# Patient Record
Sex: Male | Born: 1970 | Race: Black or African American | Hispanic: No | Smoking: Current every day smoker
Health system: Southern US, Community
[De-identification: ages and names within clinical notes are randomized; demographics above are authoritative.]

## PROBLEM LIST (undated history)

## (undated) DIAGNOSIS — J449 Chronic obstructive pulmonary disease, unspecified: Secondary | ICD-10-CM

## (undated) DIAGNOSIS — E119 Type 2 diabetes mellitus without complications: Secondary | ICD-10-CM

## (undated) DIAGNOSIS — I10 Essential (primary) hypertension: Secondary | ICD-10-CM

## (undated) HISTORY — PX: WRIST SURGERY: SHX841

## (undated) HISTORY — PX: TONSILLECTOMY: SUR1361

---

## 2013-01-05 ENCOUNTER — Emergency Department: Payer: Self-pay | Admitting: Emergency Medicine

## 2013-01-05 LAB — CBC
HCT: 46.1 % (ref 40.0–52.0)
HGB: 15.7 g/dL (ref 13.0–18.0)
MCH: 31.1 pg (ref 26.0–34.0)
MCHC: 34 g/dL (ref 32.0–36.0)
MCV: 91 fL (ref 80–100)
Platelet: 207 10*3/uL (ref 150–440)
RBC: 5.05 10*6/uL (ref 4.40–5.90)
RDW: 14.3 % (ref 11.5–14.5)
WBC: 9.8 10*3/uL (ref 3.8–10.6)

## 2013-01-05 LAB — BASIC METABOLIC PANEL
Anion Gap: 5 — ABNORMAL LOW (ref 7–16)
BUN: 12 mg/dL (ref 7–18)
Calcium, Total: 9.1 mg/dL (ref 8.5–10.1)
Chloride: 102 mmol/L (ref 98–107)
Co2: 30 mmol/L (ref 21–32)
Creatinine: 0.97 mg/dL (ref 0.60–1.30)
EGFR (African American): >60
EGFR (Non-African Amer.): >60
Glucose: 98 mg/dL (ref 65–99)
Osmolality: 274 (ref 275–301)
Potassium: 4.2 mmol/L (ref 3.5–5.1)
Sodium: 137 mmol/L (ref 136–145)

## 2013-01-05 LAB — CK TOTAL AND CKMB (NOT AT ARMC)
CK, Total: 654 U/L — ABNORMAL HIGH (ref 35–232)
CK-MB: 6.9 ng/mL — ABNORMAL HIGH (ref 0.5–3.6)

## 2013-01-05 LAB — TROPONIN I: Troponin-I: <0.02 ng/mL

## 2013-01-06 LAB — CK TOTAL AND CKMB (NOT AT ARMC)
CK, Total: 558 U/L — ABNORMAL HIGH (ref 35–232)
CK-MB: 5.5 ng/mL — ABNORMAL HIGH (ref 0.5–3.6)

## 2013-01-06 LAB — TROPONIN I: Troponin-I: <0.02 ng/mL

## 2013-04-12 ENCOUNTER — Observation Stay: Payer: Self-pay | Admitting: Internal Medicine

## 2013-04-12 LAB — BASIC METABOLIC PANEL
Anion Gap: 8 (ref 7–16)
BUN: 13 mg/dL (ref 7–18)
Calcium, Total: 8.4 mg/dL — ABNORMAL LOW (ref 8.5–10.1)
Chloride: 99 mmol/L (ref 98–107)
Co2: 29 mmol/L (ref 21–32)
Creatinine: 0.92 mg/dL (ref 0.60–1.30)
EGFR (African American): >60
EGFR (Non-African Amer.): >60
Glucose: 115 mg/dL — ABNORMAL HIGH (ref 65–99)
Osmolality: 273 (ref 275–301)
Potassium: 3.7 mmol/L (ref 3.5–5.1)
Sodium: 136 mmol/L (ref 136–145)

## 2013-04-12 LAB — TROPONIN I
Troponin-I: <0.02 ng/mL
Troponin-I: <0.02 ng/mL
Troponin-I: <0.02 ng/mL

## 2013-04-12 LAB — CBC
HCT: 44.9 % (ref 40.0–52.0)
HGB: 15.3 g/dL (ref 13.0–18.0)
MCH: 31.1 pg (ref 26.0–34.0)
MCHC: 34.2 g/dL (ref 32.0–36.0)
MCV: 91 fL (ref 80–100)
Platelet: 168 10*3/uL (ref 150–440)
RBC: 4.93 10*6/uL (ref 4.40–5.90)
RDW: 14.2 % (ref 11.5–14.5)
WBC: 5.5 10*3/uL (ref 3.8–10.6)

## 2013-04-12 LAB — PRO B NATRIURETIC PEPTIDE: B-Type Natriuretic Peptide: 20 pg/mL (ref 0–125)

## 2013-04-12 LAB — CK TOTAL AND CKMB (NOT AT ARMC)
CK, Total: 1267 U/L — ABNORMAL HIGH (ref 35–232)
CK, Total: 1188 U/L — ABNORMAL HIGH (ref 35–232)
CK, Total: 1106 U/L — ABNORMAL HIGH (ref 35–232)
CK-MB: 4.8 ng/mL — ABNORMAL HIGH (ref 0.5–3.6)
CK-MB: 4.7 ng/mL — ABNORMAL HIGH (ref 0.5–3.6)
CK-MB: 4.2 ng/mL — ABNORMAL HIGH (ref 0.5–3.6)

## 2013-11-09 ENCOUNTER — Emergency Department: Payer: Self-pay | Admitting: Emergency Medicine

## 2013-11-10 ENCOUNTER — Emergency Department: Payer: Self-pay | Admitting: Emergency Medicine

## 2013-11-10 LAB — COMPREHENSIVE METABOLIC PANEL
Albumin: 3.2 g/dL — ABNORMAL LOW (ref 3.4–5.0)
Alkaline Phosphatase: 77 U/L
Anion Gap: 2 — ABNORMAL LOW (ref 7–16)
BUN: 15 mg/dL (ref 7–18)
Bilirubin,Total: 0.2 mg/dL (ref 0.2–1.0)
Calcium, Total: 8.9 mg/dL (ref 8.5–10.1)
Chloride: 102 mmol/L (ref 98–107)
Co2: 32 mmol/L (ref 21–32)
Creatinine: 1.08 mg/dL (ref 0.60–1.30)
EGFR (African American): >60
EGFR (Non-African Amer.): >60
Glucose: 82 mg/dL (ref 65–99)
Osmolality: 272 (ref 275–301)
Potassium: 4.1 mmol/L (ref 3.5–5.1)
SGOT(AST): 26 U/L (ref 15–37)
SGPT (ALT): 47 U/L (ref 12–78)
Sodium: 136 mmol/L (ref 136–145)
Total Protein: 7.2 g/dL (ref 6.4–8.2)

## 2013-11-10 LAB — CBC
HCT: 49.1 % (ref 40.0–52.0)
HGB: 15.9 g/dL (ref 13.0–18.0)
MCH: 30 pg (ref 26.0–34.0)
MCHC: 32.3 g/dL (ref 32.0–36.0)
MCV: 93 fL (ref 80–100)
Platelet: 229 10*3/uL (ref 150–440)
RBC: 5.28 10*6/uL (ref 4.40–5.90)
RDW: 14.4 % (ref 11.5–14.5)
WBC: 9.7 10*3/uL (ref 3.8–10.6)

## 2013-11-10 LAB — TROPONIN I: Troponin-I: <0.02 ng/mL

## 2013-12-28 ENCOUNTER — Emergency Department: Payer: Self-pay | Admitting: Internal Medicine

## 2014-02-02 ENCOUNTER — Emergency Department: Payer: Self-pay | Admitting: Student

## 2014-02-25 ENCOUNTER — Ambulatory Visit: Payer: Self-pay

## 2014-02-25 ENCOUNTER — Ambulatory Visit: Payer: Self-pay | Admitting: Family Medicine

## 2014-03-07 ENCOUNTER — Emergency Department: Payer: Self-pay | Admitting: Emergency Medicine

## 2014-04-11 ENCOUNTER — Emergency Department: Payer: Self-pay | Admitting: Emergency Medicine

## 2014-05-13 ENCOUNTER — Emergency Department: Payer: Self-pay | Admitting: Emergency Medicine

## 2014-09-09 NOTE — Discharge Summary (Signed)
PATIENT NAME:  Harold Owens, Estle MR#:  [id number] DATE OF BIRTH:  September 15, 1970  DATE OF ADMISSION:  04/12/2013 DATE OF DISCHARGE:  04/12/2013  ADMITTING PHYSICIAN: Dr. Angelica Ranavid Hower.   DISCHARGING PHYSICIAN: Dr. Enid Baasadhika Choua Chalker.   PRIMARY PHYSICIAN: Currently none.   CONSULTATIONS IN THE HOSPITAL: None.   DISCHARGE DIAGNOSES:  1. Atypical chest pain.  2. Acute bronchitis with likely underlying chronic obstructive pulmonary disease exacerbation.  3. Hypertension.  4. Tobacco use disorder.  5. Obesity.   DISCHARGE HOME MEDICATIONS:  1. Prednisone taper.  2. Levaquin 500 mg p.o. daily for 6 days.  3. Albuterol inhaler 2 puffs 4 times a day as needed for wheezing.  4. Lisinopril/HCTZ 10/12.5 mg p.o. daily.   DISCHARGE DIET: Low-sodium diet.   DISCHARGE ACTIVITY: As tolerated.    FOLLOWUP INSTRUCTIONS: PCP followup in 1 to 2 weeks.   LABORATORIES AND IMAGING STUDIES PRIOR TO DISCHARGE: CK elevated at 1106. CK-MB 4.2. Troponin x 3 is negative. Chest x-ray revealing no active disease. Clear lung fields.   WBC 5.5, hemoglobin 15.3, hematocrit 44.9, platelet count 168.   Sodium 136, potassium 3.7, chloride 99, bicarb 29, BUN 13, creatinine 0.92, glucose 115 and calcium of 8.4. D-dimer was normal at 0.41.   BRIEF HOSPITAL COURSE: Harold Owens is an obese, 44 year old, African American male with past medical history significant for smoking and hypertension. Presents to the hospital secondary to cough and chest pain and dyspnea on exertion.  1. COPD exacerbation with likely acute bronchitis. The patient's chest pain was more pleuritic nature from taking deep breaths and also from the cough. He has underlying bronchitis. He was extensively wheezing on the day of exam and was given IV steroids, and his breathing has improved and he is being discharged on p.o. steroid taper and also Levaquin for bronchitis treatment as his cough has become more productive. His chest pain has improved a lot since  admission. He agreed to work on his smoking and try to cut down. He was receiving Symbicort in the hospital which he will take home at the time of discharge and was also discharged on albuterol inhaler for p.r.n. dyspnea.  2. Hypertension: The patient was discharged on his home medication lisinopril/ HCTZ.   His course has been otherwise uneventful in the hospital.   DISCHARGE CONDITION: Stable.   DISCHARGE DISPOSITION: Home.   TIME SPENT ON DISCHARGE: 40 minutes.   Care manager is helping him in trying to get an appointment [facility].   ____________________________ Enid Baasadhika Gregoria Selvy, MD rk:gb D: 04/12/2013 14:56:12 ET T: 04/12/2013 22:41:06 ET JOB#: [id number]  cc: Enid Baasadhika Tawanna Funk, MD, <Dictator> Enid BaasADHIKA Magdiel Bartles MD ELECTRONICALLY SIGNED 04/13/2013 17:51

## 2014-09-09 NOTE — H&P (Signed)
PATIENT NAME:  Harold DiversFLOYD, Harold MR#:  [id number] DATE OF BIRTH:  [birth year]  DATE OF ADMISSION:  04/12/2013  REFERRING PHYSICIAN: Dr. Ladona Ridgelaylor.   PRIMARY CARE PHYSICIAN: None.   CHIEF COMPLAINT: Chest pain, cough.   HISTORY OF PRESENT ILLNESS: This is a 44 year old African American gentleman with past medical history of hypertension, who is presenting with coughing as well as chest pain. He has had a 4 day duration of nonproductive cough with associated shortness of breath; however, denies any nausea, vomiting, fevers or chills. He does, however, mention positive sick contacts, with a girlfriend having similar symptoms. He also has noted a one-day duration of chest pain, which is described as retrosternal, 2 to 3 out of 10 in intensity, aching, radiating to the neck, worse with cough. No relieving factors. During initial work-up, he was found to have lateral EKG changes when compared to an EKG in August. Currently, he is without complaints.  REVIEW OF SYSTEMS:  CONSTITUTIONAL: Denies fevers, fatigue, weakness, pain.  EYES: Denies blurred vision, double vision, eye pain.  EARS, NOSE, THROAT: Denies tinnitus, ear pain, hearing loss.  RESPIRATORY: Positive for cough. Denies wheeze or hemoptysis.  CARDIOVASCULAR: Positive for chest pain. Denies orthopnea, edema, palpitations.  GASTROINTESTINAL: Denies nausea, vomiting, diarrhea, abdominal pain.  GENITOURINARY: Denies dysuria, hematuria.  ENDOCRINE: Denies nocturia or polyuria.  HEMATOLOGIC AND LYMPHATIC: Denies easy bruising or bleeding.  SKIN: Denies rash or lesion.  MUSCULOSKELETAL: Denies pain in neck, back, shoulders, knees, hips, or any arthritic symptoms.  NEUROLOGIC: Denies paralysis, paresthesia.  PSYCHIATRIC: Denies any anxiety or depressive symptoms.  Otherwise, full review of systems performed by me is negative.   PAST MEDICAL HISTORY: Hypertension.   SOCIAL HISTORY: Every day smoker, about 1/2 pack daily. Denies any alcohol or drug  usage.   FAMILY HISTORY: Positive for hypertension.   ALLERGIES: PENICILLIN.   HOME MEDICATIONS: Include hydrochlorothiazide/lisinopril 12.5/10 mg p.o. daily.   PHYSICAL EXAMINATION: VITAL SIGNS: Temperature 98.2, heart rate 93, respirations 20, blood pressure 153/84, saturating 97 on supplemental O2. Weight 138.3 kg, BMI 49.3.  GENERAL: Well-nourished developed, obese, African American gentleman who is currently in no acute distress.  HEAD: Normocephalic, atraumatic.  EYES: Pupils equal, round, reactive to light. Extraocular muscles intact. No scleral icterus.  MOUTH: Moist mucosal membranes. Dentition poor. No abscess noted.  EARS, NOSE, AND THROAT: Throat clear, without exudates. No external lesions. No abscess noted.  NECK: Supple. No thyromegaly. No nodules. No JVD.  PULMONARY: Scattered rhonchi, most prominent in the right upper lobe. No use of accessory muscles. Good respiratory effort.  CHEST: Nontender to palpation.  CARDIOVASCULAR: S1, S2, regular rate and rhythm. No murmurs, rubs, or gallops. No edema. Pedal pulses 2+ bilaterally.  GASTROINTESTINAL: Soft, nontender, nondistended. No masses. No hepatosplenomegaly. Positive bowel sounds, obese.  MUSCULOSKELETAL: No swelling, clubbing or edema. Range of motion full in all extremities.  NEUROLOGIC: Cranial nerves II through XII intact. No gross motor deficits. Sensation intact. Reflexes intact.  SKIN: No ulcerations, lesions, rashes, cyanosis. Skin warm, dry, turgor intact.  PSYCHIATRIC:  Mood and affect within normal limits. The patient is awake, alert, and oriented x3. Insight and judgment are intact.   LABORATORY DATA: Chest x-ray revealing no acute cardiopulmonary process. EKG performed: Normal sinus rhythm, heart rate 77; however, there are lateral T inversions in V4 through V6, with minimal voltage criteria for LVH. The T wave inversions are new when compared to previous EKG in August. Sodium 136, potassium 3.7, chloride 99,  bicarbonate 29, BUN 13, creatinine 0.92, glucose  115, calcium 8.4. Troponin I less than 0.02. WBC 5.5, hemoglobin 15.3, platelets of 168. D-dimer is 0.41.   ASSESSMENT AND PLAN: A 44 year old [place] American gentleman with history of hypertension, percent, cough and chest pain, found to have lateral EKG changes.  1.  Atypical chest pain. Admit to observation telemetry. Trend cardiac enzymes. Check lipid panel. He has been given aspirin thus far. Denies any chest pain. Will give him nitroglycerin if required.  2.  Bronchitis. Provide DuoNeb therapy and flutter, The patient is symptomatically improved. 3.  Hypertension. Continue lisinopril and hydrochlorothiazide.  4.  Deep venous thrombosis prophylaxis with heparin subcutaneous.   CODE STATUS: The patient is full code.  TIME SPENT: 45 minutes.     ____________________________ Cletis Athens. Nailani Full, MD dkh:cg D: 04/12/2013 01:41:36 ET T: 04/12/2013 03:24:34 ET JOB#: [id number]  cc: Cletis Athens. Jeliyah Middlebrooks, MD, <Dictator> Dempsey Knotek Synetta Shadow MD ELECTRONICALLY SIGNED 04/12/2013 3:44

## 2014-09-14 ENCOUNTER — Emergency Department
Admit: 2014-09-14 | Disposition: A | Discharge status: Home / Self Care | Payer: Self-pay | Admitting: Emergency Medicine

## 2014-11-27 ENCOUNTER — Emergency Department
Admission: EM | Admit: 2014-11-27 | Discharge: 2014-11-27 | Disposition: A | Discharge status: Home / Self Care | Payer: 59 | Attending: Student | Admitting: Student

## 2014-11-27 ENCOUNTER — Encounter: Payer: Self-pay | Admitting: Emergency Medicine

## 2014-11-27 DIAGNOSIS — E119 Type 2 diabetes mellitus without complications: Secondary | ICD-10-CM | POA: Diagnosis not present

## 2014-11-27 DIAGNOSIS — I10 Essential (primary) hypertension: Secondary | ICD-10-CM | POA: Diagnosis not present

## 2014-11-27 DIAGNOSIS — H109 Unspecified conjunctivitis: Secondary | ICD-10-CM

## 2014-11-27 DIAGNOSIS — Z79899 Other long term (current) drug therapy: Secondary | ICD-10-CM | POA: Diagnosis not present

## 2014-11-27 DIAGNOSIS — Z720 Tobacco use: Secondary | ICD-10-CM | POA: Diagnosis not present

## 2014-11-27 DIAGNOSIS — Z880 Allergy status to penicillin: Secondary | ICD-10-CM | POA: Insufficient documentation

## 2014-11-27 DIAGNOSIS — H578 Other specified disorders of eye and adnexa: Secondary | ICD-10-CM | POA: Diagnosis present

## 2014-11-27 HISTORY — DX: Chronic obstructive pulmonary disease, unspecified: J44.9

## 2014-11-27 HISTORY — DX: Type 2 diabetes mellitus without complications: E11.9

## 2014-11-27 HISTORY — DX: Essential (primary) hypertension: I10

## 2014-11-27 IMAGING — CR DG KNEE COMPLETE 4+V*R*
1 series · 4 of 4 positions shown · non-contrast
Comparison: None.

CLINICAL DATA: Pain, edema

EXAM:
RIGHT KNEE - COMPLETE 4+ VIEW

[Series 1: ap · 0.17mm/px · 4 of 4 slices shown]
[im 1/4]
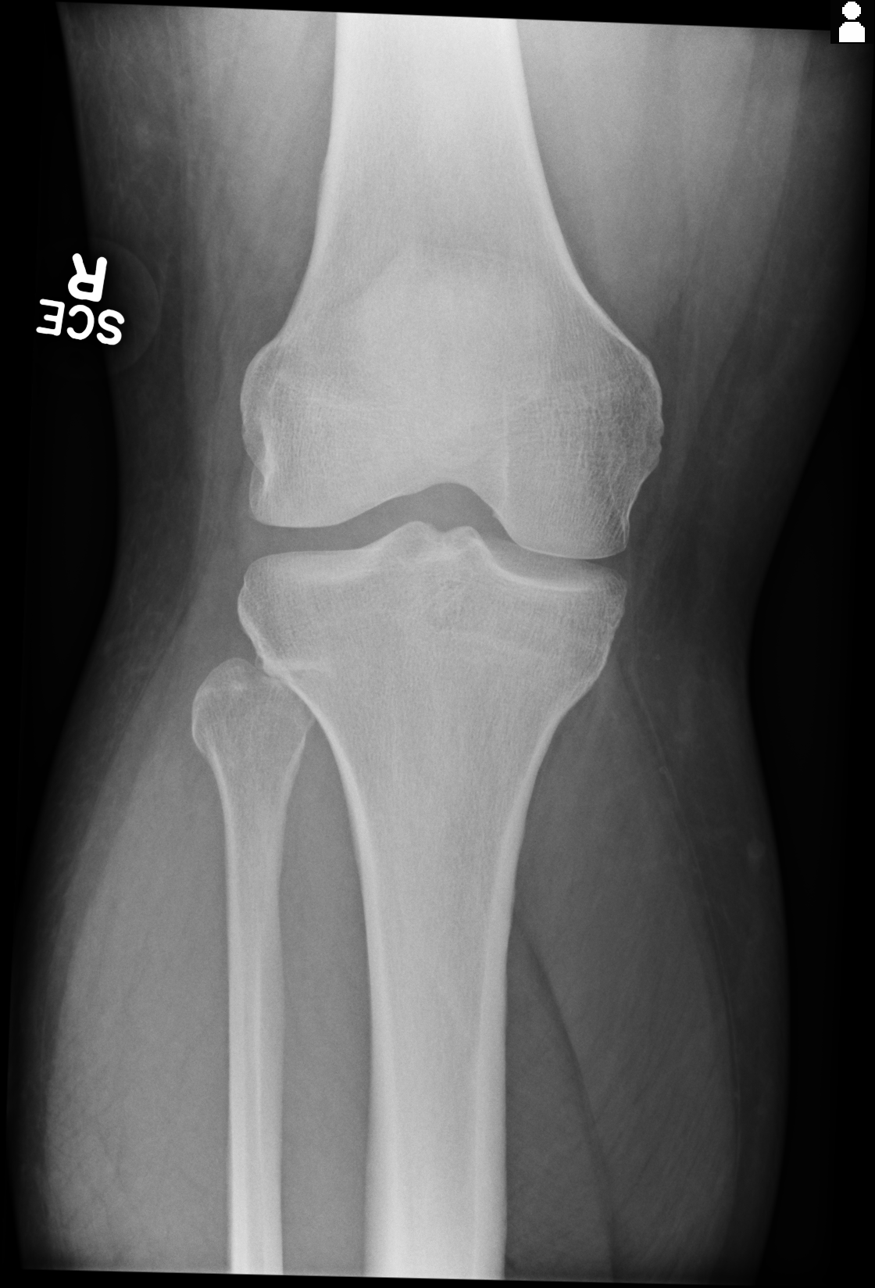
[im 2/4]
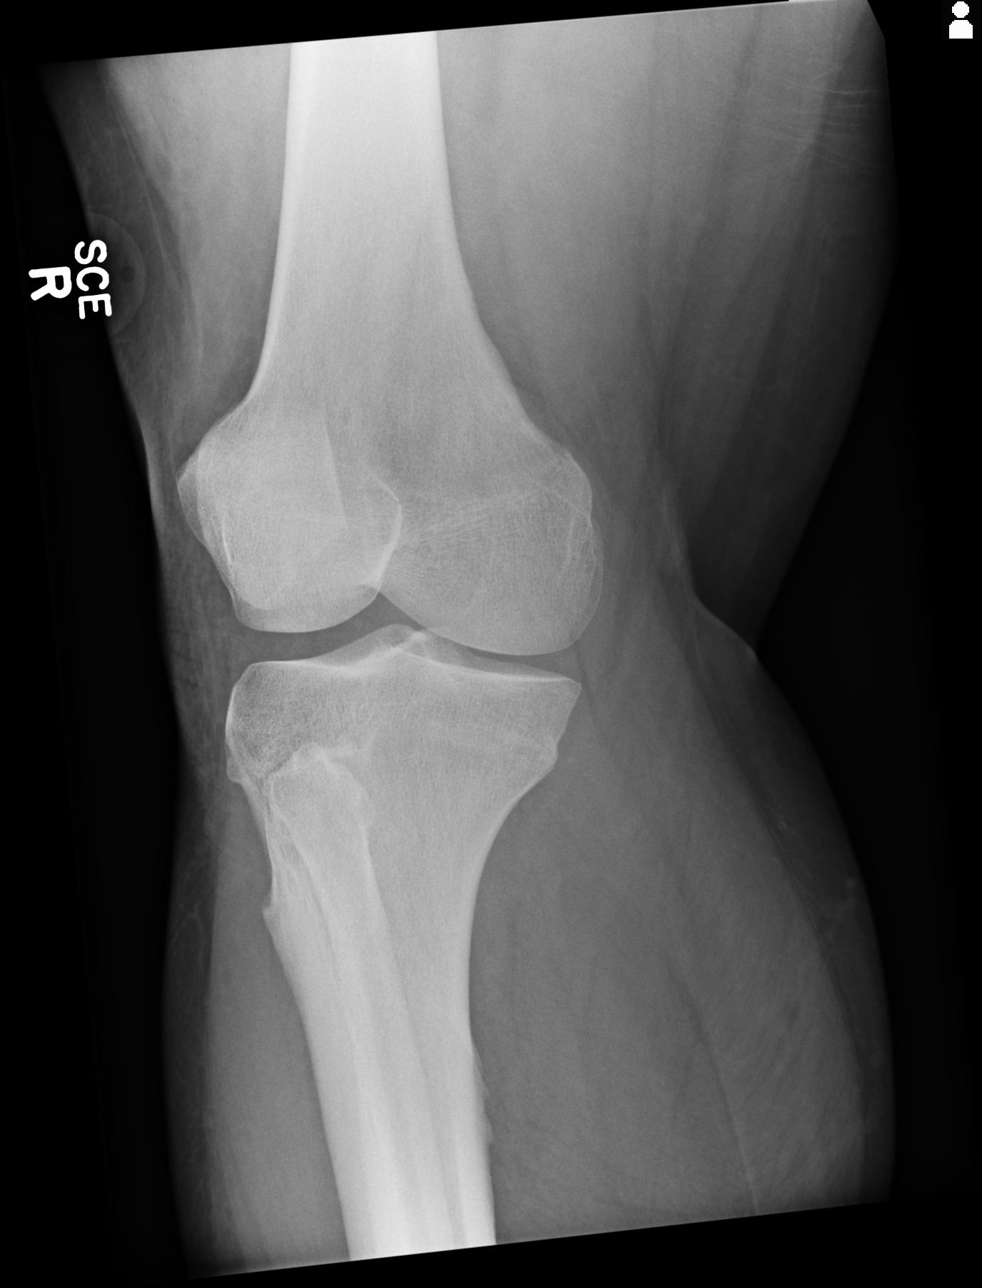
[im 3/4]
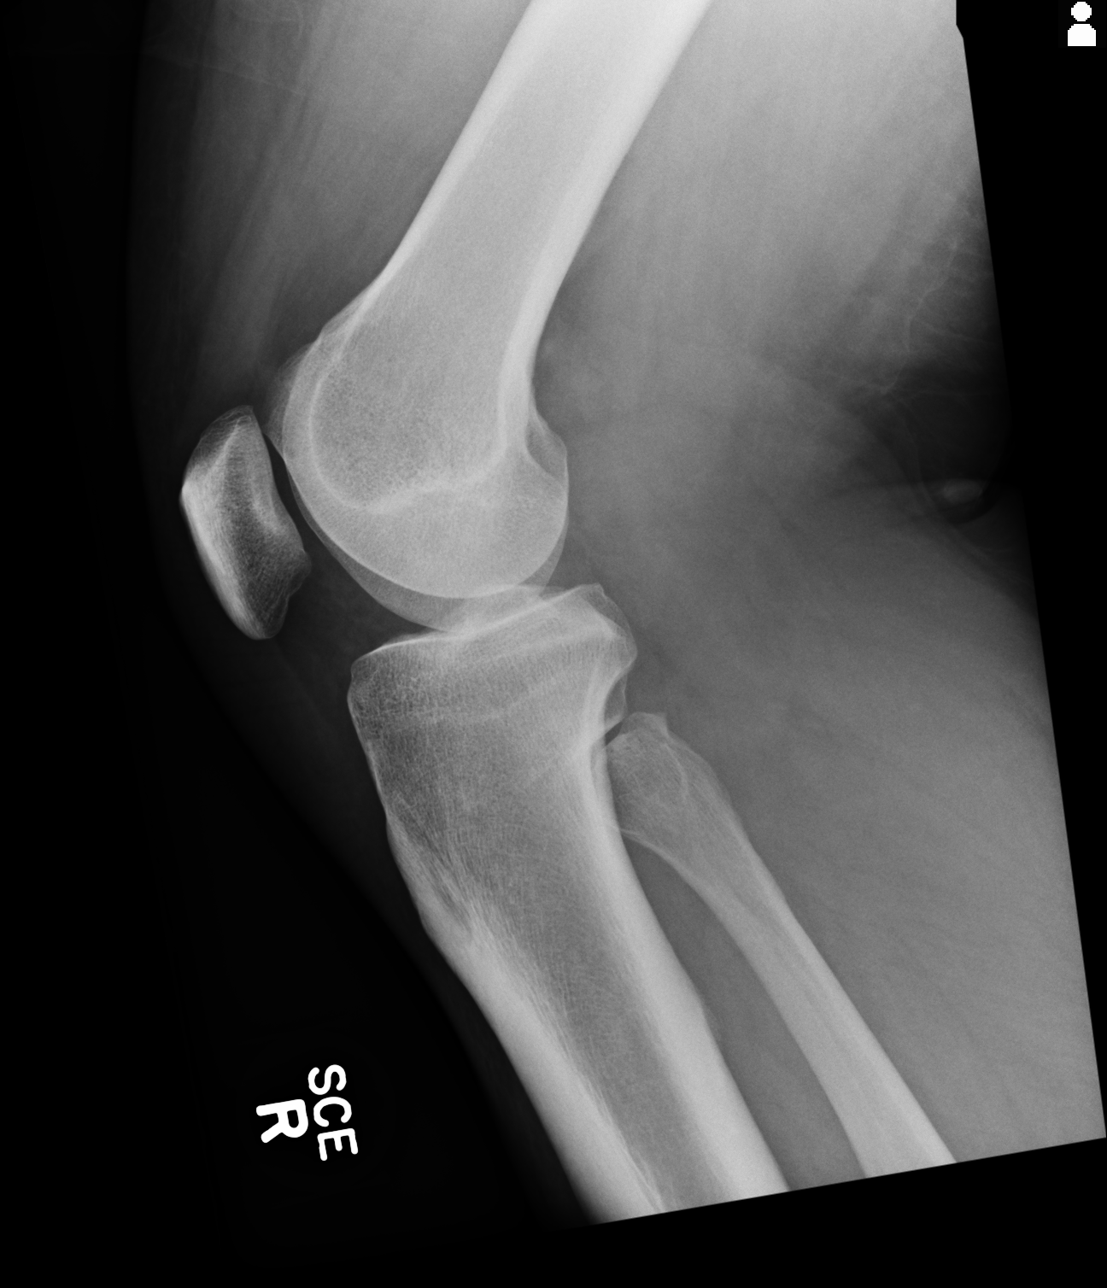
[im 4/4]
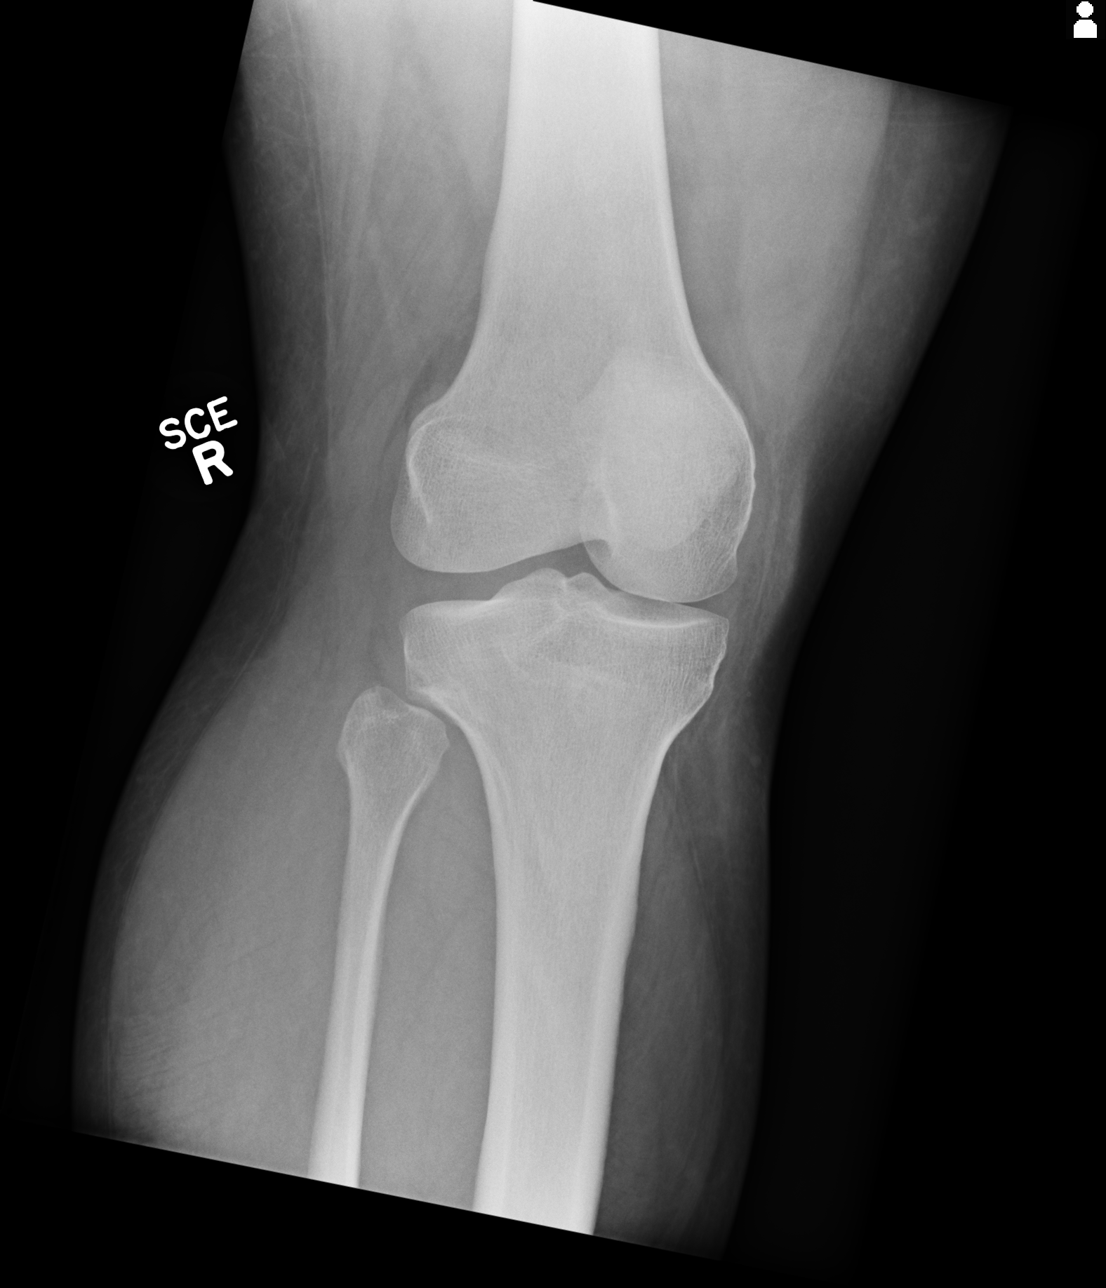

[4 of 4 positions shown; findings below may reference images not displayed]

FINDINGS: Four views of the right knee submitted. Mild narrowing of medial
joint compartment. No acute fracture or subluxation.
IMPRESSION: No acute fracture or subluxation. Mild narrowing of medial joint
compartment.

## 2014-11-27 MED ORDER — ERYTHROMYCIN 5 MG/GM OP OINT
1.0000 "application " | TOPICAL_OINTMENT | Freq: Four times a day (QID) | OPHTHALMIC | Status: DC
  Administered 2014-11-27: 1 via OPHTHALMIC
  Filled 2014-11-27: qty 1

## 2014-11-27 MED ORDER — GENTAMICIN SULFATE 0.3 % OP SOLN: 2.0000 [drp] | OPHTHALMIC | Status: DC

## 2014-11-27 NOTE — Discharge Instructions (Signed)

## 2014-11-27 NOTE — ED Provider Notes (Signed)
[facility] Emergency Department Provider Note ____________________________________________  Time seen: 2238  I have reviewed the triage vital signs and the nursing notes.  HISTORY  Chief Complaint  Burning Eyes  HPI Harold Owens is a [age] y.o. male treatment of pinkeye recurrently in the last year, reports redness and swelling in both eyes. Symptoms were initially in the right eye this morning. He describes increase blurry vision, tearing, and matting to both eyes in the last hour. He denies any fevers, chills, sweats, nausea, headache or vertigo.  Past Medical History  Diagnosis Date  . Hypertension   . Diabetes mellitus without complication     told he was border line diabetic  . COPD (chronic obstructive pulmonary disease)    There are no active problems to display for this patient.  No past surgical history on file.  Current Outpatient Rx  Name  Route  Sig  Dispense  Refill  . hydrochlorothiazide (MICROZIDE) 12.5 MG capsule   Oral   Take 12.5 mg by mouth daily.         Marland Kitchen. gentamicin (GARAMYCIN) 0.3 % ophthalmic solution   Right Eye   Place 2 drops into the right eye every 4 (four) hours.   5 mL   0    Allergies Penicillins  Family History  Problem Relation Age of Onset  . Cancer Mother   . Diabetes Sister   . Hypertension Sister   . Hypertension Brother     Social History History  Substance Use Topics  . Smoking status: Current Every Day Smoker -- 1.00 packs/day  . Smokeless tobacco: Not on file  . Alcohol Use: No   Review of Systems  Constitutional: Negative for fever. Eyes: Positive for visual changes as above ENT: Negative for sore throat. Cardiovascular: Negative for chest pain. Respiratory: Negative for shortness of breath. Gastrointestinal: Negative for abdominal pain, vomiting and diarrhea. Genitourinary: Negative for dysuria. Musculoskeletal: Negative for back pain. Skin: Negative for rash. Neurological:  Negative for headaches, focal weakness or numbness. ____________________________________________  PHYSICAL EXAM:  VITAL SIGNS: ED Triage Vitals  Enc Vitals Group     BP 11/27/14 2138 147/86 mmHg     Pulse Rate 11/27/14 2138 94     Resp --      Temp 11/27/14 2138 98.3 F (36.8 C)     Temp Source 11/27/14 2138 Oral     SpO2 11/27/14 2138 96 %     Weight 11/27/14 2138 300 lb (136.079 kg)     Height 11/27/14 2138 5\' 6"  (1.676 m)     Head Cir --      Peak Flow --      Pain Score 11/27/14 2156 3     Pain Loc --      Pain Edu? --      Excl. in GC? --    Constitutional: Alert and oriented. Well appearing and in no distress. Eyes: Conjunctivae are injected bilaterally. PERRL. Normal extraocular movements. Mild purulent matting is noted in the lashes.  ENT   Head: Normocephalic and atraumatic.   Nose: No congestion/rhinnorhea.   Mouth/Throat: Mucous membranes are moist.   Neck: Supple. No thyromegaly. Hematological/Lymphatic/Immunilogical: No cervical or preauricular lymphadenopathy. Cardiovascular: Normal rate, regular rhythm.  Respiratory: Normal respiratory effort. Musculoskeletal: Nontender with normal range of motion in all extremities.  Neurologic:  Normal gait without ataxia. Normal speech and language. No gross focal neurologic deficits are appreciated. Skin:  Skin is warm, dry and intact. No rash noted. Psychiatric: Mood and  affect are normal. Patient exhibits appropriate insight and judgment. ____________________________________________  PROCEDURES  Erythromycin ophthalmic ointment instilled in both eyes ____________________________________________  INITIAL IMPRESSION / ASSESSMENT AND PLAN / ED COURSE  Acute conjunctivitis. Treatment with Garamycin x 5 days. Follow-up optometrist as needed.  ____________________________________________  FINAL CLINICAL IMPRESSION(S) / ED DIAGNOSES  Final diagnoses:  Conjunctivitis of both eyes     Lissa Hoard, PA-C 11/27/14 2300  Gayla Doss, MD 11/28/14 [phone]

## 2014-11-27 NOTE — ED Notes (Signed)
AAOx3.  Skin warm and dry.  NAD.  D/C home 

## 2014-11-27 NOTE — ED Notes (Signed)
Pt. Has redness and swelling in both eyes.  Pt. States symptoms first started rt. Eye this morining.  Pt. States hx of pink eye.  Pt. States having pink eye three times last year.  Pt. States both eyes feel blurry in last hour.

## 2014-11-28 IMAGING — CR DG CHEST 2V
1 series · 2 of 2 positions shown · non-contrast
Comparison: 04/12/2013

CLINICAL DATA: Chest pain

EXAM:
CHEST  2 VIEW

[Series 1: w chest pa · 0.14mm/px · 2 of 2 slices shown]
[im 1/2]
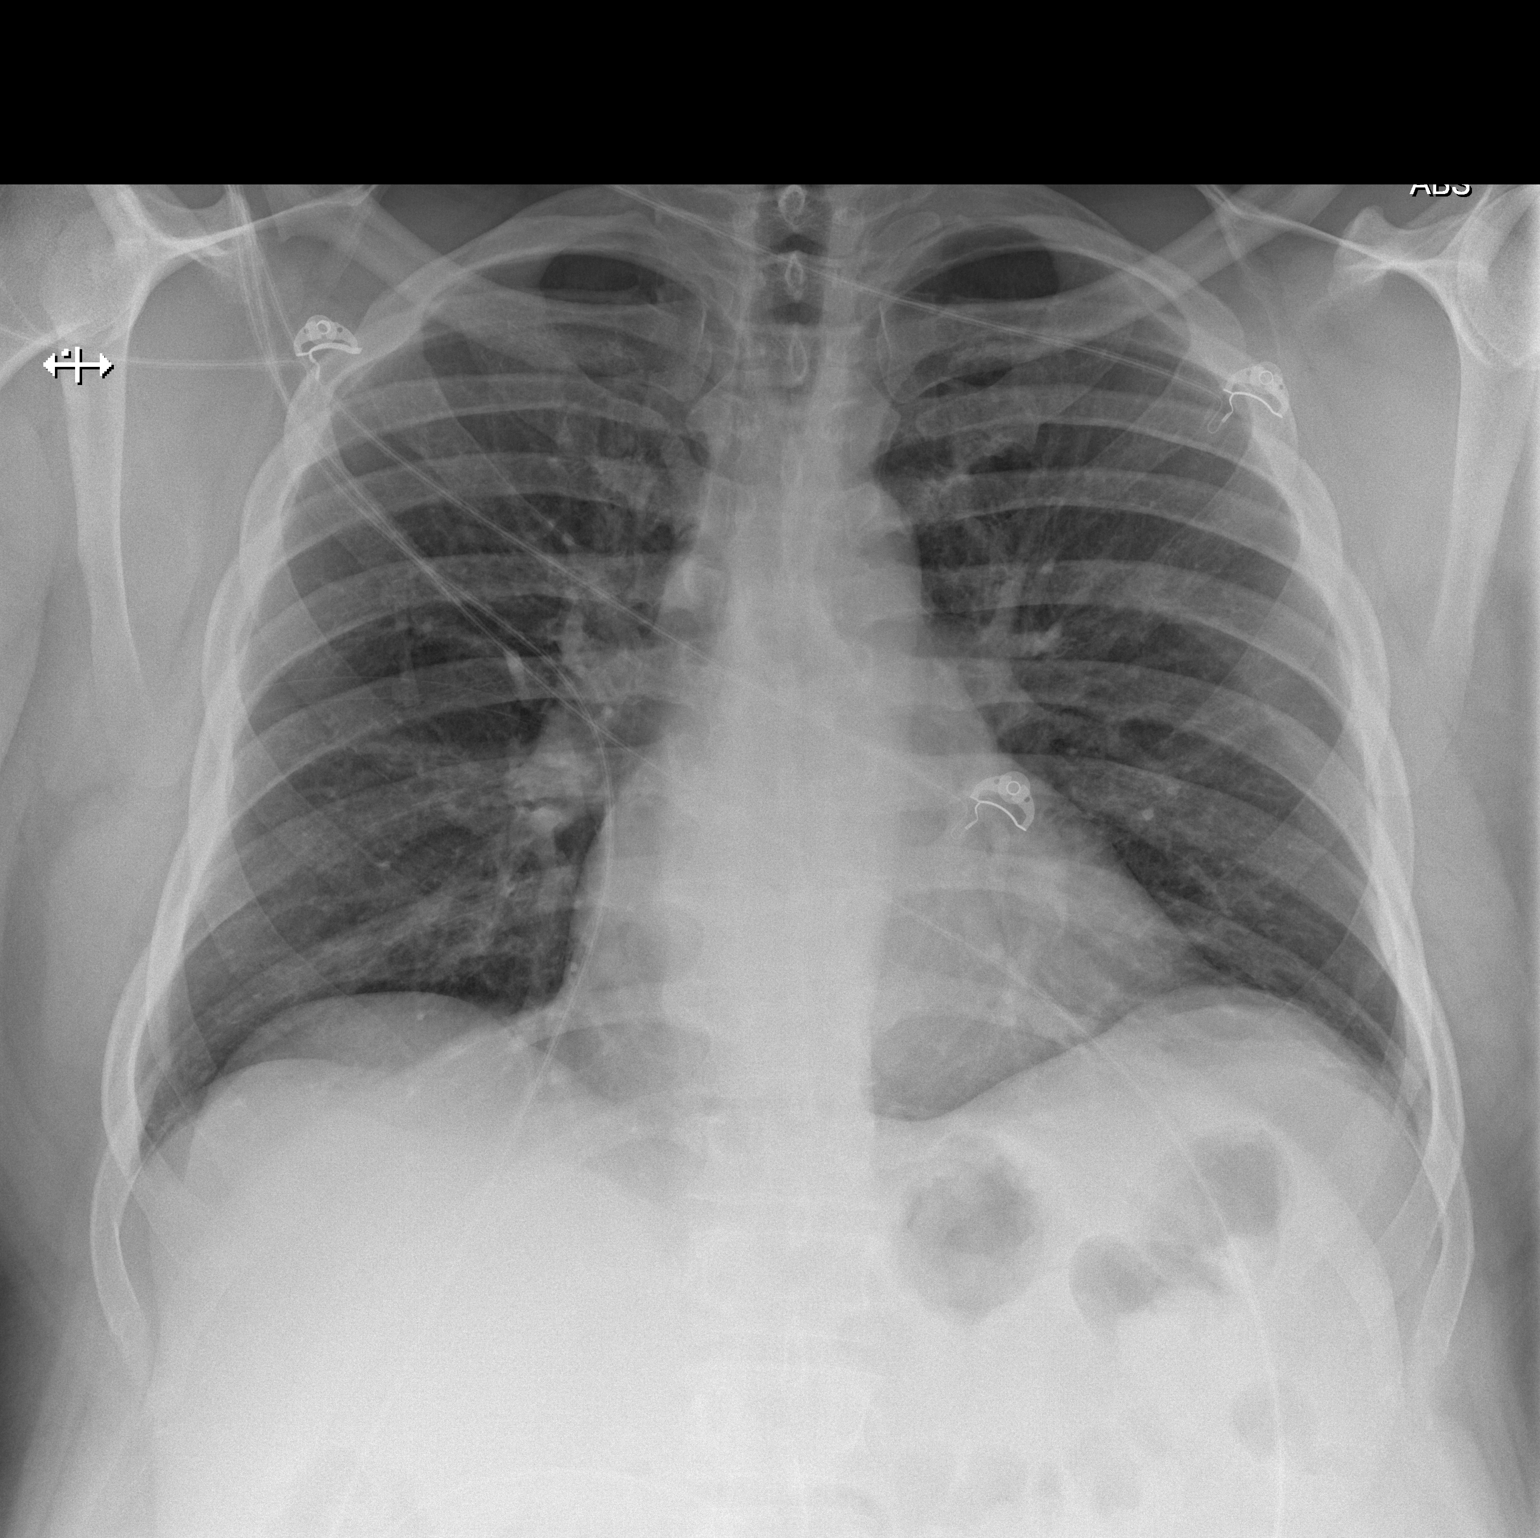
[im 2/2]
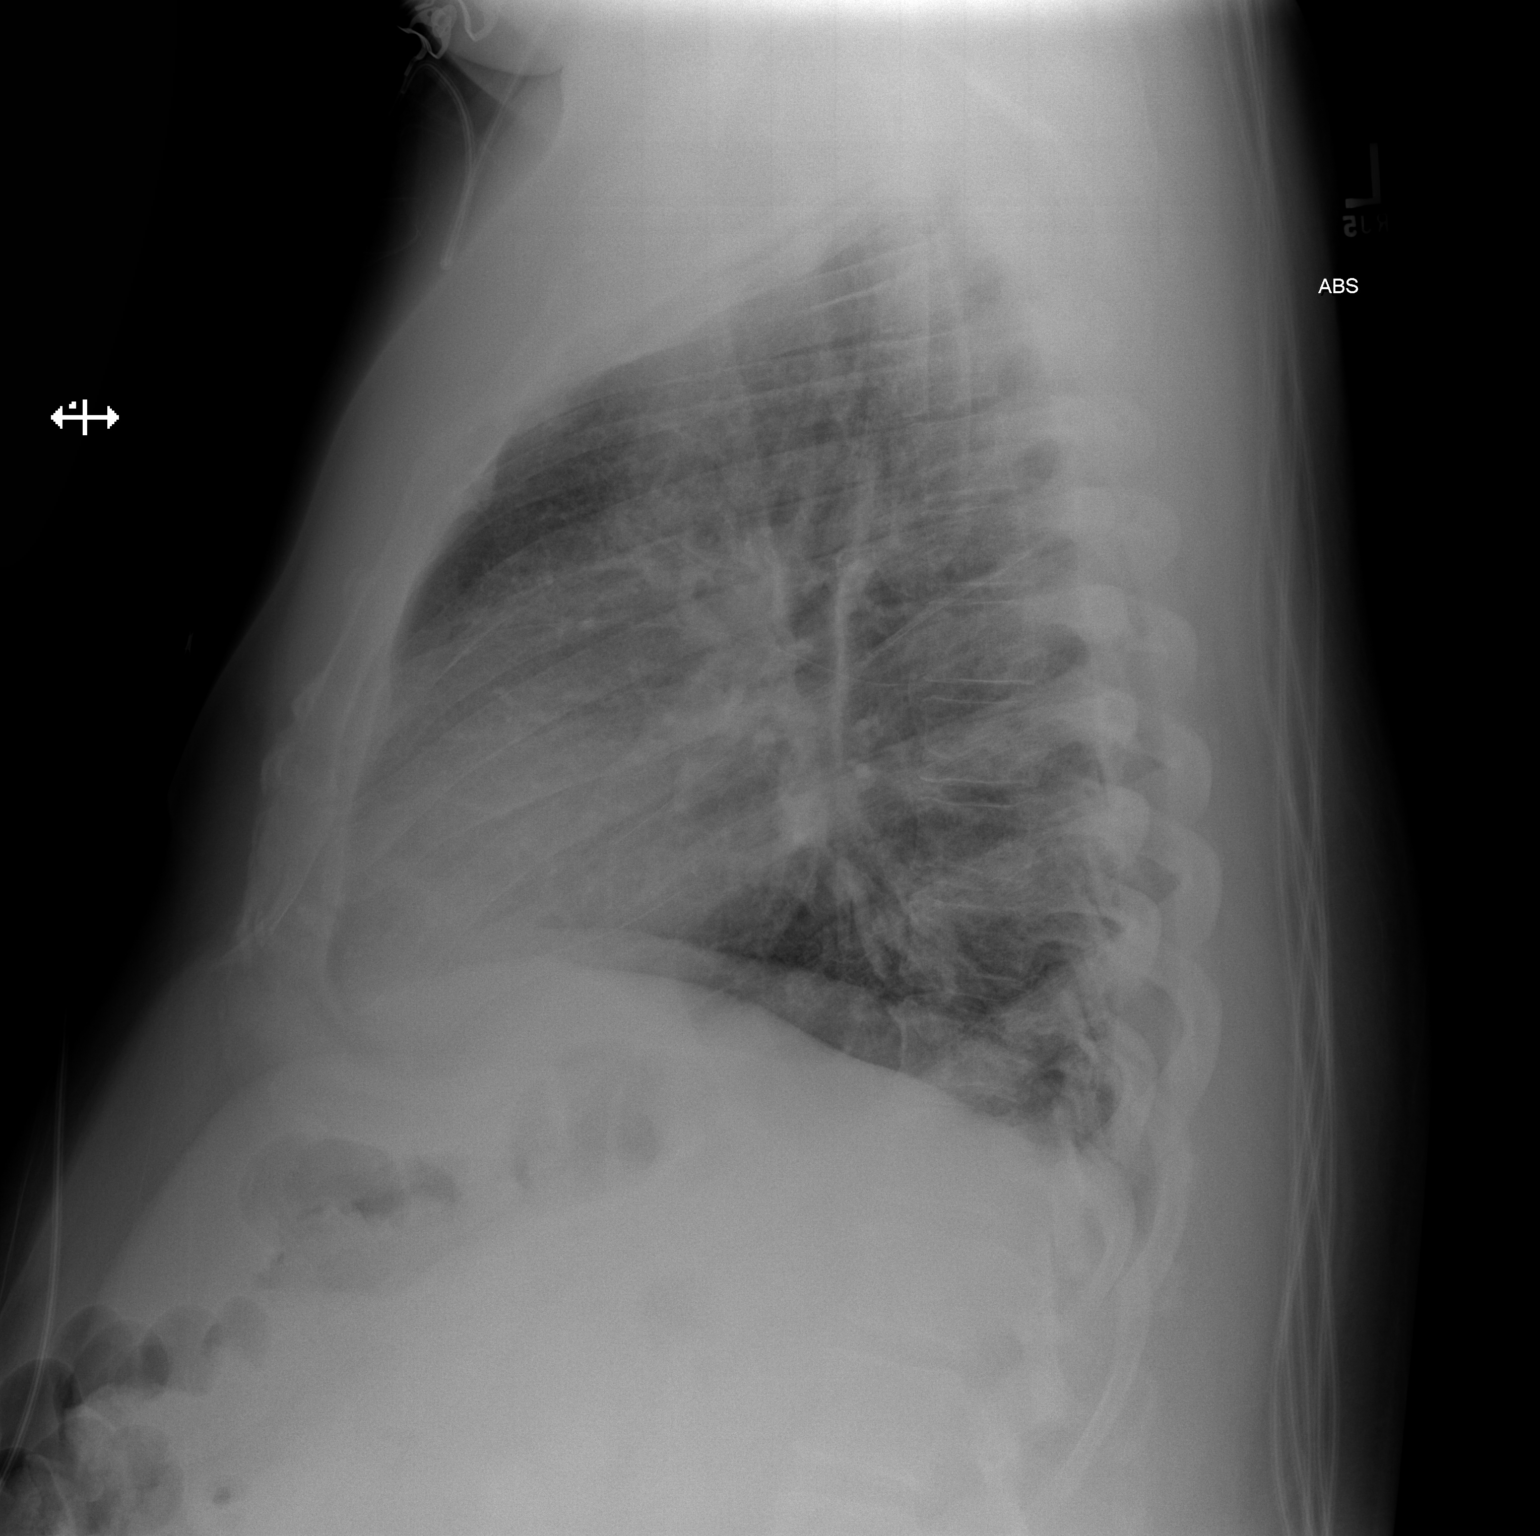

[2 of 2 positions shown; findings below may reference images not displayed]

FINDINGS: The heart size and mediastinal contours are within normal limits.
Both lungs are clear. The visualized skeletal structures are
unremarkable.
IMPRESSION: No active cardiopulmonary disease.

## 2014-12-15 ENCOUNTER — Emergency Department
Admission: EM | Admit: 2014-12-15 | Discharge: 2014-12-15 | Disposition: A | Discharge status: Home / Self Care | Payer: 59 | Attending: Emergency Medicine | Admitting: Emergency Medicine

## 2014-12-15 DIAGNOSIS — H6001 Abscess of right external ear: Secondary | ICD-10-CM | POA: Diagnosis not present

## 2014-12-15 DIAGNOSIS — Z720 Tobacco use: Secondary | ICD-10-CM | POA: Insufficient documentation

## 2014-12-15 DIAGNOSIS — I10 Essential (primary) hypertension: Secondary | ICD-10-CM | POA: Diagnosis not present

## 2014-12-15 DIAGNOSIS — L989 Disorder of the skin and subcutaneous tissue, unspecified: Secondary | ICD-10-CM | POA: Diagnosis present

## 2014-12-15 DIAGNOSIS — Z792 Long term (current) use of antibiotics: Secondary | ICD-10-CM | POA: Insufficient documentation

## 2014-12-15 DIAGNOSIS — Z79899 Other long term (current) drug therapy: Secondary | ICD-10-CM | POA: Insufficient documentation

## 2014-12-15 MED ORDER — HYDROCODONE-ACETAMINOPHEN 5-325 MG PO TABS: 1.0000 | ORAL_TABLET | ORAL | Status: DC | PRN

## 2014-12-15 MED ORDER — SULFAMETHOXAZOLE-TRIMETHOPRIM 800-160 MG PO TABS: 1.0000 | ORAL_TABLET | Freq: Two times a day (BID) | ORAL | Status: DC

## 2014-12-15 MED ORDER — LIDOCAINE HCL (PF) 1 % IJ SOLN
5.0000 mL | Freq: Once | INTRAMUSCULAR | Status: AC
  Administered 2014-12-15: 5 mL

## 2014-12-15 MED ORDER — LIDOCAINE HCL (PF) 1 % IJ SOLN
INTRAMUSCULAR | Status: AC
  Administered 2014-12-15: 5 mL
  Filled 2014-12-15: qty 5

## 2014-12-15 NOTE — ED Notes (Signed)
Raised area behind right ear, painful. Appeared Monday. Pt alert and oriented X4, active, cooperative, pt in NAD. RR even and unlabored, color WNL.

## 2014-12-15 NOTE — ED Notes (Signed)
Patient has a history of cysts that come up.  States this one started on Monday and has progressively gotten worse.  Denies difficulty chewing, or swallowing.

## 2014-12-15 NOTE — Discharge Instructions (Signed)
Abscess An abscess (boil or furuncle) is an infected area on or under the skin. This area is filled with yellowish-white fluid (pus) and other material (debris). HOME CARE   Only take medicines as told by your doctor.  If you were given antibiotic medicine, take it as directed. Finish the medicine even if you start to feel better.  If gauze is used, follow your doctor's directions for changing the gauze.  To avoid spreading the infection:  Keep your abscess covered with a bandage.  Wash your hands well.  Do not share personal care items, towels, or whirlpools with others.  Avoid skin contact with others.  Keep your skin and clothes clean around the abscess.  Keep all doctor visits as told. GET HELP RIGHT AWAY IF:   You have more pain, puffiness (swelling), or redness in the wound site.  You have more fluid or blood coming from the wound site.  You have muscle aches, chills, or you feel sick.  You have a fever. MAKE SURE YOU:   Understand these instructions.  Will watch your condition.  Will get help right away if you are not doing well or get worse. Document Released: 10/23/2007 Document Revised: 11/05/2011 Document Reviewed: 07/19/2011 [facility] Patient Information 2015 [place], [place]. This information is not intended to replace advice given to you by your health care provider. Make sure you discuss any questions you have with your health care provider.    RETURN TO ER IN 2 DAYS FOR PACKING REMOVAL SEPTRA DS FOR INFECTION FOR 10 DAYS AND NORCO FOR PAIN AS NEEDED

## 2014-12-15 NOTE — ED Provider Notes (Signed)
[facility] Emergency Department Provider Note  ____________________________________________  Time seen:  8:37 AM  I have reviewed the triage vital signs and the nursing notes.   HISTORY  Chief Complaint Cyst   HPI Harold Owens is a 44 y.o. male is here with complaint of constant cyst behind her right ear. He states is been there for 4 days and has become more painful.He states he has had cyst on his thigh before. He has not been told that he has a history of MRSA. Over-the-counter medication is not helping with pain. Currently his pain is 10 over 10. He denies any fever.   Past Medical History  Diagnosis Date  . Hypertension   . COPD (chronic obstructive pulmonary disease)     There are no active problems to display for this patient.   History reviewed. No pertinent past surgical history.  Current Outpatient Rx  Name  Route  Sig  Dispense  Refill  . gentamicin (GARAMYCIN) 0.3 % ophthalmic solution   Right Eye   Place 2 drops into the right eye every 4 (four) hours.   5 mL   0   . hydrochlorothiazide (MICROZIDE) 12.5 MG capsule   Oral   Take 12.5 mg by mouth daily.         Marland Kitchen HYDROcodone-acetaminophen (NORCO/VICODIN) 5-325 MG per tablet   Oral   Take 1 tablet by mouth every 4 (four) hours as needed for moderate pain.   20 tablet   0   . sulfamethoxazole-trimethoprim (BACTRIM DS,SEPTRA DS) 800-160 MG per tablet   Oral   Take 1 tablet by mouth 2 (two) times daily.   20 tablet   0     Allergies Penicillins  Family History  Problem Relation Age of Onset  . Cancer Mother   . Diabetes Sister   . Hypertension Sister   . Hypertension Brother     Social History History  Substance Use Topics  . Smoking status: Current Every Day Smoker -- 1.00 packs/day  . Smokeless tobacco: Not on file  . Alcohol Use: No    Review of Systems Constitutional: No fever/chills Eyes: No visual changes. ENT: No sore throat. Cardiovascular:  Denies chest pain. Respiratory: Denies shortness of breath. Gastrointestinal: No abdominal pain.  No nausea, no vomiting. Genitourinary: Negative for dysuria. Musculoskeletal: Negative for back pain. Skin: Negative for rash. Positive for cyst behind right ear. Neurological: Negative for headaches, focal weakness or numbness.  10-point ROS otherwise negative.  ____________________________________________   PHYSICAL EXAM:  VITAL SIGNS: ED Triage Vitals  Enc Vitals Group     BP --      Pulse --      Resp --      Temp --      Temp src --      SpO2 --      Weight --      Height --      Head Cir --      Peak Flow --      Pain Score 12/15/14 0822 10     Pain Loc --      Pain Edu? --      Excl. in GC? --     Constitutional: Alert and oriented. Well appearing and in no acute distress. Eyes: Conjunctivae are normal. PERRL. EOMI. Head: Atraumatic. Nose: No congestion/rhinnorhea. Neck: No stridor.   Hematological/Lymphatic/Immunilogical: Right lateral cervical lymphadenopathy. Cardiovascular: Normal rate, regular rhythm. Grossly normal heart sounds.  Good peripheral circulation. Respiratory: Normal respiratory effort.  No retractions. Lungs CTAB. Gastrointestinal: Soft and nontender. No distention.  Musculoskeletal: No lower extremity tenderness nor edema.  No joint effusions. Neurologic:  Normal speech and language. No gross focal neurologic deficits are appreciated. No gait instability. Skin:  Skin is warm, dry and intact. No rash noted. There is one single localized fluctuant nodule behind the right earlobe. Psychiatric: Mood and affect are normal. Speech and behavior are normal.  ____________________________________________   LABS (all labs ordered are listed, but only abnormal results are displayed)  Labs Reviewed - No data to display  PROCEDURES  Procedure(s) performed: INCISION AND DRAINAGE Performed by: Tommi Rumps Consent: Verbal consent obtained. Risks  and benefits: risks, benefits and alternatives were discussed Type: abscess  Body area:posterior earlobe Asthesia: local infiltration  Incision was made with a scalpel.  Local anesthetic: lidocai1% without epinephrine  Anesthetic 1ml  Drainage: purulent  Drainage amount: small   Packing  material: 1/4 in iodoform gauze  Patient tolerance: Patient tolerated the procedure well with no immediate complications.    Critical Care performed: No  ____________________________________________   INITIAL IMPRESSION / ASSESSMENT AND PLAN / ED COURSE  Pertinent labs & imaging results that were available during my care of the patient were reviewed by me and considered in my medical decision making (see chart for details).  Patient was started on Septra DS for infection and Norco as needed for pain. Patient is return in 2 days for packing removal. He was also given a note for work for next 2 days. ____________________________________________   FINAL CLINICAL IMPRESSION(S) / ED DIAGNOSES  Final diagnoses:  Abscess, earlobe, right      Tommi Rumps, PA-C 12/15/14 1235  Sharman Cheek, MD 12/15/14 [phone]

## 2014-12-17 ENCOUNTER — Emergency Department
Admission: EM | Admit: 2014-12-17 | Discharge: 2014-12-17 | Disposition: A | Discharge status: Home / Self Care | Payer: 59 | Attending: Emergency Medicine | Admitting: Emergency Medicine

## 2014-12-17 ENCOUNTER — Encounter: Payer: Self-pay | Admitting: Emergency Medicine

## 2014-12-17 DIAGNOSIS — Z5189 Encounter for other specified aftercare: Secondary | ICD-10-CM

## 2014-12-17 DIAGNOSIS — Z792 Long term (current) use of antibiotics: Secondary | ICD-10-CM | POA: Diagnosis not present

## 2014-12-17 DIAGNOSIS — Z4801 Encounter for change or removal of surgical wound dressing: Secondary | ICD-10-CM | POA: Insufficient documentation

## 2014-12-17 DIAGNOSIS — Z720 Tobacco use: Secondary | ICD-10-CM | POA: Insufficient documentation

## 2014-12-17 DIAGNOSIS — Z880 Allergy status to penicillin: Secondary | ICD-10-CM | POA: Insufficient documentation

## 2014-12-17 DIAGNOSIS — I10 Essential (primary) hypertension: Secondary | ICD-10-CM | POA: Diagnosis not present

## 2014-12-17 NOTE — Discharge Instructions (Signed)
WARM COMPRESSES TO AREA FREQUENTLY AND CONTINUE TAKING YOUR ANTIBIOTICS UNTIL FINISHED RETURN TO ER IF ANY PROBLEMS

## 2014-12-17 NOTE — ED Notes (Signed)
Assessment performed by NP. Wound has no signs of infection at this time. Packing has fallen out. Pt instructed by NP to continue with medications and warm compress.

## 2014-12-17 NOTE — ED Provider Notes (Signed)
[facility] Emergency Department Provider Note  ____________________________________________  Time seen:  12:43 PM  I have reviewed the triage vital signs and the nursing notes.   HISTORY  Chief Complaint Wound Check   HPI Harold Owens is a 44 y.o. male Saturday for wound recheck and packing removal from his right earlobe. Patient states the packing fell out last evening from his right earlobe. He continues to take the antibiotic Septra without any difficulty. He states there has been some drainage continuing but this area feels a great deal better. Currently patient states his pain is 4 out of 10.   Past Medical History  Diagnosis Date  . Hypertension   . COPD (chronic obstructive pulmonary disease)     There are no active problems to display for this patient.   History reviewed. No pertinent past surgical history.  Current Outpatient Rx  Name  Route  Sig  Dispense  Refill  . gentamicin (GARAMYCIN) 0.3 % ophthalmic solution   Right Eye   Place 2 drops into the right eye every 4 (four) hours.   5 mL   0   . hydrochlorothiazide (MICROZIDE) 12.5 MG capsule   Oral   Take 12.5 mg by mouth daily.         Marland Kitchen HYDROcodone-acetaminophen (NORCO/VICODIN) 5-325 MG per tablet   Oral   Take 1 tablet by mouth every 4 (four) hours as needed for moderate pain.   20 tablet   0   . sulfamethoxazole-trimethoprim (BACTRIM DS,SEPTRA DS) 800-160 MG per tablet   Oral   Take 1 tablet by mouth 2 (two) times daily.   20 tablet   0     Allergies Penicillins  Family History  Problem Relation Age of Onset  . Cancer Mother   . Diabetes Sister   . Hypertension Sister   . Hypertension Brother     Social History History  Substance Use Topics  . Smoking status: Current Every Day Smoker -- 1.00 packs/day  . Smokeless tobacco: Not on file  . Alcohol Use: No    Review of Systems Constitutional: No fever/chills Cardiovascular: Denies chest  pain. Respiratory: Denies shortness of breath. Gastrointestinal: No abdominal pain.  No nausea, no vomiting. Skin: Negative for rash. Neurological: Negative for headaches, focal weakness or numbness.  10-point ROS otherwise negative.  ____________________________________________   PHYSICAL EXAM:  VITAL SIGNS: ED Triage Vitals  Enc Vitals Group     BP 12/17/14 1220 124/73 mmHg     Pulse Rate 12/17/14 1220 86     Resp 12/17/14 1220 18     Temp 12/17/14 1220 98.4 F (36.9 C)     Temp Source 12/17/14 1220 Oral     SpO2 12/17/14 1220 98 %     Weight 12/17/14 1220 300 lb (136.079 kg)     Height 12/17/14 1220  (1.676 m)     Head Cir --      Peak Flow --      Pain Score 12/17/14 1221 4     Pain Loc --      Pain Edu? --      Excl. in GC? --     Constitutional: Alert and oriented. Well appearing and in no acute distress. Eyes: Conjunctivae are normal. PERRL. EOMI. Head: Atraumatic. Nose: No congestion/rhinnorhea. Neck: No stridor.   Respiratory: Normal respiratory effort.  No retractions Gastrointestinal: Soft and nontender. No distention. Musculoskeletal: No lower extremity tenderness nor edema.  No joint effusions. Neurologic:  Normal speech  and language. No gross focal neurologic deficits are appreciated. No gait instability. Skin:  Right posterior earlobe healing without any complications. Drain is out Psychiatric: Mood and affect are normal. Speech and behavior are normal.  ____________________________________________   LABS (all labs ordered are listed, but only abnormal results are displayed)  Labs Reviewed - No data to display  PROCEDURES  Procedure(s) performed: None  Critical Care performed: No  ____________________________________________   INITIAL IMPRESSION / ASSESSMENT AND PLAN / ED COURSE  Pertinent labs & imaging results that were available during my care of the patient were reviewed by me and considered in my medical decision making (see  chart for details).  Patient is continue taking his antibiotic until completely finished. He will return if any severe worsening of his symptoms. ____________________________________________   FINAL CLINICAL IMPRESSION(S) / ED DIAGNOSES  Final diagnoses:  Wound check, abscess      Tommi Rumps, PA-C 12/17/14 1432  Emily Filbert, MD 12/17/14 1501

## 2014-12-17 NOTE — ED Notes (Signed)
Patient reports he is here for recheck of wound/abscess that was packed to right ear. States packing fell out last night, has been doing well.

## 2015-02-01 ENCOUNTER — Emergency Department
Admission: EM | Admit: 2015-02-01 | Discharge: 2015-02-01 | Disposition: A | Discharge status: Home / Self Care | Payer: 59 | Attending: Emergency Medicine | Admitting: Emergency Medicine

## 2015-02-01 DIAGNOSIS — Z880 Allergy status to penicillin: Secondary | ICD-10-CM | POA: Insufficient documentation

## 2015-02-01 DIAGNOSIS — J3089 Other allergic rhinitis: Secondary | ICD-10-CM | POA: Insufficient documentation

## 2015-02-01 DIAGNOSIS — Z79899 Other long term (current) drug therapy: Secondary | ICD-10-CM | POA: Insufficient documentation

## 2015-02-01 DIAGNOSIS — I10 Essential (primary) hypertension: Secondary | ICD-10-CM | POA: Insufficient documentation

## 2015-02-01 DIAGNOSIS — Z720 Tobacco use: Secondary | ICD-10-CM | POA: Insufficient documentation

## 2015-02-01 MED ORDER — BENZONATATE 100 MG PO CAPS: 100.0000 mg | ORAL_CAPSULE | Freq: Three times a day (TID) | ORAL | Status: DC | PRN

## 2015-02-01 MED ORDER — FLUTICASONE PROPIONATE 50 MCG/ACT NA SUSP: 1.0000 | Freq: Every day | NASAL | Status: DC

## 2015-02-01 NOTE — ED Provider Notes (Signed)
[facility] Emergency Department Provider Note ____________________________________________  Time seen: 1025  I have reviewed the triage vital signs and the nursing notes.  HISTORY  Chief Complaint  URI  HPI Harold Owens is a 44 y.o. male reports to the ED for evaluation of an onset of cough and congestion and body aches and chest today. He denies any outright fevers, chills, sweats. He notes some intermittently productive cough as well as some postnasal drainage. He dose some NyQuil Liqui-Gels without significant relief to his symptoms.  Past Medical History  Diagnosis Date  . Hypertension   . COPD (chronic obstructive pulmonary disease)     There are no active problems to display for this patient.   History reviewed. No pertinent past surgical history.  Current Outpatient Rx  Name  Route  Sig  Dispense  Refill  . benzonatate (TESSALON PERLES) 100 MG capsule   Oral   Take 1 capsule (100 mg total) by mouth 3 (three) times daily as needed for cough (Take 1-2 per dose).   30 capsule   0   . fluticasone (FLONASE) 50 MCG/ACT nasal spray   Each Nare   Place 1 spray into both nostrils daily.   16 g   0   . gentamicin (GARAMYCIN) 0.3 % ophthalmic solution   Right Eye   Place 2 drops into the right eye every 4 (four) hours.   5 mL   0   . hydrochlorothiazide (MICROZIDE) 12.5 MG capsule   Oral   Take 12.5 mg by mouth daily.         Marland Kitchen HYDROcodone-acetaminophen (NORCO/VICODIN) 5-325 MG per tablet   Oral   Take 1 tablet by mouth every 4 (four) hours as needed for moderate pain.   20 tablet   0   . sulfamethoxazole-trimethoprim (BACTRIM DS,SEPTRA DS) 800-160 MG per tablet   Oral   Take 1 tablet by mouth 2 (two) times daily.   20 tablet   0    Allergies Penicillins  Family History  Problem Relation Age of Onset  . Cancer Mother   . Diabetes Sister   . Hypertension Sister   . Hypertension Brother    Social History Social  History  Substance Use Topics  . Smoking status: Current Every Day Smoker -- 1.00 packs/day  . Smokeless tobacco: None  . Alcohol Use: No   Review of Systems  Constitutional: Negative for fever. Eyes: Negative for visual changes. ENT: Negative for sore throat. Reports cough & congestion Cardiovascular: Negative for chest pain. Respiratory: Negative for shortness of breath. Gastrointestinal: Negative for abdominal pain, vomiting and diarrhea. Genitourinary: Negative for dysuria. Musculoskeletal: Negative for back pain. Skin: Negative for rash. Neurological: Negative for headaches, focal weakness or numbness. ____________________________________________  PHYSICAL EXAM:  VITAL SIGNS: ED Triage Vitals  Enc Vitals Group     BP 02/01/15 0951 148/89 mmHg     Pulse Rate 02/01/15 0951 82     Resp 02/01/15 0951 19     Temp 02/01/15 0951 98.4 F (36.9 C)     Temp Source 02/01/15 0951 Oral     SpO2 02/01/15 0951 97 %     Weight 02/01/15 0951 300 lb (136.079 kg)     Height 02/01/15 0951 5\' 6"  (1.676 m)     Head Cir --      Peak Flow --      Pain Score 02/01/15 0952 9     Pain Loc --      Pain Edu? --  Excl. in GC? --    Constitutional: Alert and oriented. Well appearing and in no distress. Eyes: Conjunctivae are normal. PERRL. Normal extraocular movements. Ears: Canals clear ant TMs intact without effusion   Head: Normocephalic and atraumatic.   Nose: Nasal turbinates erythematous and edematous. Cloudy rhinorrhea.   Mouth/Throat: Mucous membranes are moist.   Neck: Supple. No thyromegaly. Hematological/Lymphatic/Immunological: No cervical lymphadenopathy. Cardiovascular: Normal rate, regular rhythm.  Respiratory: Normal respiratory effort. No wheezes/rales/rhonchi. Gastrointestinal: Soft and nontender. No distention. Musculoskeletal: Nontender with normal range of motion in all extremities.  Neurologic:  Normal gait without ataxia. Normal speech and language.  No gross focal neurologic deficits are appreciated. Skin:  Skin is warm, dry and intact. No rash noted. Psychiatric: Mood and affect are normal. Patient exhibits appropriate insight and judgment. ____________________________________________  INITIAL IMPRESSION / ASSESSMENT AND PLAN / ED COURSE  Acute rhinitis with sinus formation. Likely allergic in nature. We'll treat with Flonase, Tessalon Perles, and over-the-counter allergy medicine. Patient followed with primary care provider for ongoing symptoms. Work note provided for today. ____________________________________________  FINAL CLINICAL IMPRESSION(S) / ED DIAGNOSES  Final diagnoses:  Other allergic rhinitis     Lissa Hoard, PA-C 02/01/15 1040  Governor Rooks, MD 02/01/15 1525

## 2015-02-01 NOTE — Discharge Instructions (Signed)
Allergic Rhinitis Allergic rhinitis is when the mucous membranes in the nose respond to allergens. Allergens are particles in the air that cause your body to have an allergic reaction. This causes you to release allergic antibodies. Through a chain of events, these eventually cause you to release histamine into the blood stream. Although meant to protect the body, it is this release of histamine that causes your discomfort, such as frequent sneezing, congestion, and an itchy, runny nose.  CAUSES  Seasonal allergic rhinitis (hay fever) is caused by pollen allergens that may come from grasses, trees, and weeds. Year-round allergic rhinitis (perennial allergic rhinitis) is caused by allergens such as house dust mites, pet dander, and mold spores.  SYMPTOMS   Nasal stuffiness (congestion).  Itchy, runny nose with sneezing and tearing of the eyes. DIAGNOSIS  Your health care provider can help you determine the allergen or allergens that trigger your symptoms. If you and your health care provider are unable to determine the allergen, skin or blood testing may be used. TREATMENT  Allergic rhinitis does not have a cure, but it can be controlled by:  Medicines and allergy shots (immunotherapy).  Avoiding the allergen. Hay fever may often be treated with antihistamines in pill or nasal spray forms. Antihistamines block the effects of histamine. There are over-the-counter medicines that may help with nasal congestion and swelling around the eyes. Check with your health care provider before taking or giving this medicine.  If avoiding the allergen or the medicine prescribed do not work, there are many new medicines your health care provider can prescribe. Stronger medicine may be used if initial measures are ineffective. Desensitizing injections can be used if medicine and avoidance does not work. Desensitization is when a patient is given ongoing shots until the body becomes less sensitive to the allergen.  Make sure you follow up with your health care provider if problems continue. HOME CARE INSTRUCTIONS It is not possible to completely avoid allergens, but you can reduce your symptoms by taking steps to limit your exposure to them. It helps to know exactly what you are allergic to so that you can avoid your specific triggers. SEEK MEDICAL CARE IF:   You have a fever.  You develop a cough that does not stop easily (persistent).  You have shortness of breath.  You start wheezing.  Symptoms interfere with normal daily activities. Document Released: 01/29/2001 Document Revised: 05/11/2013 Document Reviewed: 01/11/2013 [facility] Patient Information 2015 [place], [place]. This information is not intended to replace advice given to you by your health care provider. Make sure you discuss any questions you have with your health care provider.  Sinusitis Sinusitis is redness, soreness, and puffiness (inflammation) of the air pockets in the bones of your face (sinuses). The redness, soreness, and puffiness can cause air and mucus to get trapped in your sinuses. This can allow germs to grow and cause an infection.  HOME CARE   Drink enough fluids to keep your pee (urine) clear or pale yellow.  Use a humidifier in your home.  Run a hot shower to create steam in the bathroom. Sit in the bathroom with the door closed. Breathe in the steam 3-4 times a day.  Put a warm, moist washcloth on your face 3-4 times a day, or as told by your doctor.  Use salt water sprays (saline sprays) to wet the thick fluid in your nose. This can help the sinuses drain.  Only take medicine as told by your doctor. GET HELP RIGHT  AWAY IF:   Your pain gets worse.  You have very bad headaches.  You are sick to your stomach (nauseous).  You throw up (vomit).  You are very sleepy (drowsy) all the time.  Your face is puffy (swollen).  Your vision changes.  You have a stiff neck.  You have trouble breathing. MAKE  SURE YOU:   Understand these instructions.  Will watch your condition.  Will get help right away if you are not doing well or get worse. Document Released: 10/23/2007 Document Revised: 01/29/2012 Document Reviewed: 12/10/2011 [facility] Patient Information 2015 [place], [place]. This information is not intended to replace advice given to you by your health care provider. Make sure you discuss any questions you have with your health care provider.  Take the prescription meds as directed.  Start an OTC allergy medicine (Claritin/Zyrtec/Allegra).

## 2015-02-01 NOTE — ED Notes (Signed)
Patient discharged with instructions verbalized.

## 2015-02-01 NOTE — ED Notes (Signed)
Pt c/o cough with congestion and bodyaches since yesterday. 

## 2015-02-07 ENCOUNTER — Emergency Department
Admission: EM | Admit: 2015-02-07 | Discharge: 2015-02-07 | Disposition: A | Discharge status: Home / Self Care | Payer: 59 | Attending: Emergency Medicine | Admitting: Emergency Medicine

## 2015-02-07 ENCOUNTER — Encounter: Payer: Self-pay | Admitting: Emergency Medicine

## 2015-02-07 ENCOUNTER — Emergency Department: Payer: 59

## 2015-02-07 DIAGNOSIS — I10 Essential (primary) hypertension: Secondary | ICD-10-CM | POA: Insufficient documentation

## 2015-02-07 DIAGNOSIS — Z720 Tobacco use: Secondary | ICD-10-CM | POA: Insufficient documentation

## 2015-02-07 DIAGNOSIS — Z792 Long term (current) use of antibiotics: Secondary | ICD-10-CM | POA: Insufficient documentation

## 2015-02-07 DIAGNOSIS — Z79899 Other long term (current) drug therapy: Secondary | ICD-10-CM | POA: Insufficient documentation

## 2015-02-07 DIAGNOSIS — J0101 Acute recurrent maxillary sinusitis: Secondary | ICD-10-CM | POA: Insufficient documentation

## 2015-02-07 DIAGNOSIS — Z880 Allergy status to penicillin: Secondary | ICD-10-CM | POA: Insufficient documentation

## 2015-02-07 MED ORDER — FEXOFENADINE-PSEUDOEPHED ER 60-120 MG PO TB12: 1.0000 | ORAL_TABLET | Freq: Two times a day (BID) | ORAL | Status: DC

## 2015-02-07 MED ORDER — SULFAMETHOXAZOLE-TRIMETHOPRIM 800-160 MG PO TABS: 1.0000 | ORAL_TABLET | Freq: Two times a day (BID) | ORAL | Status: DC

## 2015-02-07 MED ORDER — PSEUDOEPHEDRINE HCL ER 120 MG PO TB12
120.0000 mg | ORAL_TABLET | Freq: Two times a day (BID) | ORAL | Status: DC
  Administered 2015-02-07: 120 mg via ORAL
  Filled 2015-02-07 (×2): qty 1

## 2015-02-07 MED ORDER — SULFAMETHOXAZOLE-TRIMETHOPRIM 800-160 MG PO TABS
1.0000 | ORAL_TABLET | Freq: Once | ORAL | Status: AC
  Administered 2015-02-07: 1 via ORAL
  Filled 2015-02-07: qty 1

## 2015-02-07 NOTE — ED Provider Notes (Signed)
[facility] Emergency Department Provider Note  ____________________________________________  Time seen: Approximately 5:56 PM  I have reviewed the triage vital signs and the nursing notes.   HISTORY  Chief Complaint Cough    HPI Harold Owens is a 44 y.o. male patient here today for follow-up for rest or infection seen on 02/01/2015. Patient states that no relief with previous medications Flonase and Tessalon Perles. Patient states had increasing facial pressure and postnasal drainage and constant coughing. Patient state the cough is worse when he laid out at night. He stated he wakes up in morning with coughing spells. Patient denies any fever or chills associated this complaint is on nausea vomiting diarrhea.She has a history of COPD.   Past Medical History  Diagnosis Date  . Hypertension   . COPD (chronic obstructive pulmonary disease)     There are no active problems to display for this patient.   History reviewed. No pertinent past surgical history.  Current Outpatient Rx  Name  Route  Sig  Dispense  Refill  . benzonatate (TESSALON PERLES) 100 MG capsule   Oral   Take 1 capsule (100 mg total) by mouth 3 (three) times daily as needed for cough (Take 1-2 per dose).   30 capsule   0   . fexofenadine-pseudoephedrine (ALLEGRA-D) 60-120 MG per tablet   Oral   Take 1 tablet by mouth 2 (two) times daily.   30 tablet   0   . fluticasone (FLONASE) 50 MCG/ACT nasal spray   Each Nare   Place 1 spray into both nostrils daily.   16 g   0   . gentamicin (GARAMYCIN) 0.3 % ophthalmic solution   Right Eye   Place 2 drops into the right eye every 4 (four) hours.   5 mL   0   . hydrochlorothiazide (MICROZIDE) 12.5 MG capsule   Oral   Take 12.5 mg by mouth daily.         Marland Kitchen HYDROcodone-acetaminophen (NORCO/VICODIN) 5-325 MG per tablet   Oral   Take 1 tablet by mouth every 4 (four) hours as needed for moderate pain.   20 tablet   0   .  sulfamethoxazole-trimethoprim (BACTRIM DS,SEPTRA DS) 800-160 MG per tablet   Oral   Take 1 tablet by mouth 2 (two) times daily.   20 tablet   0   . sulfamethoxazole-trimethoprim (BACTRIM DS,SEPTRA DS) 800-160 MG per tablet   Oral   Take 1 tablet by mouth 2 (two) times daily.   20 tablet   0     Allergies Penicillins  Family History  Problem Relation Age of Onset  . Cancer Mother   . Diabetes Sister   . Hypertension Sister   . Hypertension Brother     Social History Social History  Substance Use Topics  . Smoking status: Current Every Day Smoker -- 1.00 packs/day  . Smokeless tobacco: None  . Alcohol Use: No    Review of Systems Constitutional: No fever/chills Eyes: No visual changes. ENT: Sore throat  Cardiovascular: Productive cough Respiratory: Denies shortness of breath. Productive cough Gastrointestinal: No abdominal pain.  No nausea, no vomiting.  No diarrhea.  No constipation. Genitourinary: Negative for dysuria. Musculoskeletal: Negative for back pain. Skin: Negative for rash. Neurological: Negative for headaches, focal weakness or numbness. Endocrine:Hypertension 10-point ROS otherwise negative.  ____________________________________________   PHYSICAL EXAM:  VITAL SIGNS: ED Triage Vitals  Enc Vitals Group     BP 02/07/15 1719 156/88 mmHg  Pulse Rate 02/07/15 1719 76     Resp 02/07/15 1719 20     Temp 02/07/15 1719 98.3 F (36.8 C)     Temp Source 02/07/15 1719 Oral     SpO2 02/07/15 1719 98 %     Weight 02/07/15 1719 300 lb (136.079 kg)     Height 02/07/15 1719  (1.676 m)     Head Cir --      Peak Flow --      Pain Score 02/07/15 1724 7     Pain Loc --      Pain Edu? --      Excl. in GC? --     Constitutional: Alert and oriented. Well appearing and in no acute distress. Obesity Eyes: Conjunctivae are normal. PERRL. EOMI. Head: Atraumatic. Nose: Edematous nasal turbinates with bilateral maxillary guarding. Mouth/Throat:  Mucous membranes are moist.  Oropharynx non-erythematous. Visible postnasal drainage  Neck: No stridor.   Hematological/Lymphatic/Immunilogical: No cervical lymphadenopathy. Cardiovascular: Normal rate, regular rhythm. Grossly normal heart sounds.  Good peripheral circulation. Mild elevation systolic blood pressure Respiratory: Normal respiratory effort.  No retractions. Moderate rales Gastrointestinal: Soft and nontender. No distention. No abdominal bruits. No CVA tenderness. Musculoskeletal: No lower extremity tenderness nor edema.  No joint effusions. Neurologic:  Normal speech and language. No gross focal neurologic deficits are appreciated. No gait instability. Skin:  Skin is warm, dry and intact. No rash noted. Psychiatric: Mood and affect are normal. Speech and behavior are normal.  ____________________________________________   LABS (all labs ordered are listed, but only abnormal results are displayed)  Labs Reviewed - No data to display ____________________________________________  EKG   ____________________________________________  RADIOLOGY  No acute findings on chest x-ray. I, Joni Reining, personally viewed and evaluated these images (plain radiographs) as part of my medical decision making.   ____________________________________________   PROCEDURES  Procedure(s) performed: None  Critical Care performed: No  ____________________________________________   INITIAL IMPRESSION / ASSESSMENT AND PLAN / ED COURSE  Pertinent labs & imaging results that were available during my care of the patient were reviewed by me and considered in my medical decision making (see chart for details Maxillary sinusitis. Patient given prescription for Allegra-D and Bactrim DS. Patient [facility] ____________________________________________   FINAL CLINICAL IMPRESSION(S) / ED DIAGNOSES  Final diagnoses:  Acute recurrent maxillary sinusitis       Joni Reining, PA-C 02/07/15 1913  Loleta Rose, MD 02/07/15 2125

## 2015-02-07 NOTE — ED Notes (Signed)
States he thinks he has a sinus infection having sinus drainage and some SOB

## 2015-03-06 ENCOUNTER — Emergency Department: Payer: Self-pay

## 2015-03-06 ENCOUNTER — Emergency Department
Admission: EM | Admit: 2015-03-06 | Discharge: 2015-03-06 | Disposition: A | Discharge status: Home / Self Care | Payer: Self-pay | Attending: Emergency Medicine | Admitting: Emergency Medicine

## 2015-03-06 DIAGNOSIS — J069 Acute upper respiratory infection, unspecified: Secondary | ICD-10-CM | POA: Insufficient documentation

## 2015-03-06 DIAGNOSIS — J441 Chronic obstructive pulmonary disease with (acute) exacerbation: Secondary | ICD-10-CM | POA: Insufficient documentation

## 2015-03-06 DIAGNOSIS — Z880 Allergy status to penicillin: Secondary | ICD-10-CM | POA: Insufficient documentation

## 2015-03-06 DIAGNOSIS — I10 Essential (primary) hypertension: Secondary | ICD-10-CM | POA: Insufficient documentation

## 2015-03-06 DIAGNOSIS — R112 Nausea with vomiting, unspecified: Secondary | ICD-10-CM | POA: Insufficient documentation

## 2015-03-06 DIAGNOSIS — Z792 Long term (current) use of antibiotics: Secondary | ICD-10-CM | POA: Insufficient documentation

## 2015-03-06 DIAGNOSIS — Z720 Tobacco use: Secondary | ICD-10-CM | POA: Insufficient documentation

## 2015-03-06 DIAGNOSIS — Z79899 Other long term (current) drug therapy: Secondary | ICD-10-CM | POA: Insufficient documentation

## 2015-03-06 MED ORDER — AZITHROMYCIN 250 MG PO TABS: ORAL_TABLET | ORAL | Status: DC

## 2015-03-06 MED ORDER — ONDANSETRON 8 MG PO TBDP
8.0000 mg | ORAL_TABLET | Freq: Once | ORAL | Status: AC
  Administered 2015-03-06: 8 mg via ORAL
  Filled 2015-03-06: qty 1

## 2015-03-06 MED ORDER — GUAIFENESIN-CODEINE 100-10 MG/5ML PO SOLN: 10.0000 mL | ORAL | Status: DC | PRN

## 2015-03-06 MED ORDER — IBUPROFEN 800 MG PO TABS
800.0000 mg | ORAL_TABLET | Freq: Three times a day (TID) | ORAL | Status: DC | PRN
Start: 2015-03-06 — End: 2015-07-20

## 2015-03-06 NOTE — ED Provider Notes (Signed)
[facility] Emergency Department Provider Note  ____________________________________________  Time seen: Approximately 7:53 AM  I have reviewed the triage vital signs and the nursing notes.   HISTORY  Chief Complaint Cough    HPI Harold Owens is a 44 y.o. male who presents for evaluation of cough, congestion, headache and body aches. Patient states symptoms started over the weekend for the last couple days. He reports getting better from a sinus infection and now has developed a cough and body aches positive for fever and chills at home but none now.   Past Medical History  Diagnosis Date  . Hypertension   . COPD (chronic obstructive pulmonary disease) (HCC)     There are no active problems to display for this patient.   Past Surgical History  Procedure Laterality Date  . Tonsillectomy      Current Outpatient Rx  Name  Route  Sig  Dispense  Refill  . lisinopril-hydrochlorothiazide (PRINZIDE,ZESTORETIC) 20-25 MG tablet   Oral   Take 1 tablet by mouth daily.         Marland Kitchen azithromycin (ZITHROMAX Z-PAK) 250 MG tablet      Take 2 tablets (500 mg) on  Day 1,  followed by 1 tablet (250 mg) once daily on Days 2 through 5.   6 each   0   . benzonatate (TESSALON PERLES) 100 MG capsule   Oral   Take 1 capsule (100 mg total) by mouth 3 (three) times daily as needed for cough (Take 1-2 per dose).   30 capsule   0   . fexofenadine-pseudoephedrine (ALLEGRA-D) 60-120 MG per tablet   Oral   Take 1 tablet by mouth 2 (two) times daily.   30 tablet   0   . fluticasone (FLONASE) 50 MCG/ACT nasal spray   Each Nare   Place 1 spray into both nostrils daily.   16 g   0   . gentamicin (GARAMYCIN) 0.3 % ophthalmic solution   Right Eye   Place 2 drops into the right eye every 4 (four) hours.   5 mL   0   . guaiFENesin-codeine 100-10 MG/5ML syrup   Oral   Take 10 mLs by mouth every 4 (four) hours as needed for cough.   180 mL   0   .  hydrochlorothiazide (MICROZIDE) 12.5 MG capsule   Oral   Take 12.5 mg by mouth daily.         Marland Kitchen HYDROcodone-acetaminophen (NORCO/VICODIN) 5-325 MG per tablet   Oral   Take 1 tablet by mouth every 4 (four) hours as needed for moderate pain.   20 tablet   0   . ibuprofen (ADVIL,MOTRIN) 800 MG tablet   Oral   Take 1 tablet (800 mg total) by mouth every 8 (eight) hours as needed.   30 tablet   0   . sulfamethoxazole-trimethoprim (BACTRIM DS,SEPTRA DS) 800-160 MG per tablet   Oral   Take 1 tablet by mouth 2 (two) times daily.   20 tablet   0   . sulfamethoxazole-trimethoprim (BACTRIM DS,SEPTRA DS) 800-160 MG per tablet   Oral   Take 1 tablet by mouth 2 (two) times daily.   20 tablet   0     Allergies Penicillins  Family History  Problem Relation Age of Onset  . Cancer Mother   . Diabetes Sister   . Hypertension Sister   . Hypertension Brother     Social History Social History  Substance Use Topics  .  Smoking status: Current Every Day Smoker -- 1.00 packs/day  . Smokeless tobacco: None  . Alcohol Use: No    Review of Systems Constitutional: No fever/chills Eyes: No visual changes. ENT: No sore throat. If her head and nasal congestion. Cardiovascular: Denies chest pain. Respiratory: Positive for cough Gastrointestinal: No abdominal pain.  Positive for nausea and vomiting.  No diarrhea.  No constipation. Genitourinary: Negative for dysuria. Musculoskeletal: Negative for back pain. Skin: Negative for rash. Neurological: Negative for headaches, focal weakness or numbness.  10-point ROS otherwise negative.  ____________________________________________   PHYSICAL EXAM:  VITAL SIGNS: ED Triage Vitals  Enc Vitals Group     BP 03/06/15 0711 166/96 mmHg     Pulse Rate 03/06/15 0710 87     Resp 03/06/15 0710 20     Temp 03/06/15 0710 98.3 F (36.8 C)     Temp Source 03/06/15 0710 Oral     SpO2 03/06/15 0710 97 %     Weight 03/06/15 0710 290 lb (131.543  kg)     Height 03/06/15 0710 5\' 6"  (1.676 m)     Head Cir --      Peak Flow --      Pain Score 03/06/15 0710 8     Pain Loc --      Pain Edu? --      Excl. in GC? --     Constitutional: Alert and oriented. Well appearing and in no acute distress. Eyes: Conjunctivae are normal. PERRL. EOMI. Head: Atraumatic. Nose: No congestion/rhinnorhea. Minimal turbinate swelling noted. Mouth/Throat: Mucous membranes are moist.  Oropharynx non-erythematous. Neck: No stridor.   Cardiovascular: Normal rate, regular rhythm. Grossly normal heart sounds.  Good peripheral circulation. Respiratory: Normal respiratory effort.  No retractions. Lungs with minimal wheezing noted to the right lower lobe Musculoskeletal: No lower extremity tenderness nor edema.  No joint effusions. Neurologic:  Normal speech and language. No gross focal neurologic deficits are appreciated. No gait instability. Skin:  Skin is warm, dry and intact. No rash noted. Psychiatric: Mood and affect are normal. Speech and behavior are normal.  ____________________________________________   LABS (all labs ordered are listed, but only abnormal results are displayed)  Labs Reviewed - No data to display ____________________________________________   RADIOLOGY  Chest x-ray normal. No evidence of pneumonia or atelectasis. Interpreted by radiologist and reviewed by myself. ____________________________________________   PROCEDURES  Procedure(s) performed: None  Critical Care performed: No  ____________________________________________   INITIAL IMPRESSION / ASSESSMENT AND PLAN / ED COURSE  Pertinent labs & imaging results that were available during my care of the patient were reviewed by me and considered in my medical decision making (see chart for details).  URI. Rx given for Z-Pak as directed, Robitussin with codeine, ibuprofen 800 mg. With PCP or return to ER with any worsening symptomology. Patient denies any other  emergency medical complaints at this time. ____________________________________________   FINAL CLINICAL IMPRESSION(S) / ED DIAGNOSES  Final diagnoses:  Acute URI      Evangeline Dakinharles M Beers, PA-C 03/07/15 1456  Jennye MoccasinBrian S Quigley, MD 03/07/15 [phone]

## 2015-03-06 NOTE — ED Notes (Signed)
Pt c/o cough with sinus and chest congestion since yesterday and is having a HA with it.

## 2015-03-06 NOTE — Discharge Instructions (Signed)
Upper Respiratory Infection, Adult Most upper respiratory infections (URIs) are a viral infection of the air passages leading to the lungs. A URI affects the nose, throat, and upper air passages. The most common type of URI is nasopharyngitis and is typically referred to as "the common cold." URIs run their course and usually go away on their own. Most of the time, a URI does not require medical attention, but sometimes a bacterial infection in the upper airways can follow a viral infection. This is called a secondary infection. Sinus and middle ear infections are common types of secondary upper respiratory infections. Bacterial pneumonia can also complicate a URI. A URI can worsen asthma and chronic obstructive pulmonary disease (COPD). Sometimes, these complications can require emergency medical care and may be life threatening.  CAUSES Almost all URIs are caused by viruses. A virus is a type of germ and can spread from one person to another.  RISKS FACTORS You may be at risk for a URI if:   You smoke.   You have chronic heart or lung disease.  You have a weakened defense (immune) system.   You are very young or very old.   You have nasal allergies or asthma.  You work in crowded or poorly ventilated areas.  You work in health care facilities or schools. SIGNS AND SYMPTOMS  Symptoms typically develop 2-3 days after you come in contact with a cold virus. Most viral URIs last 7-10 days. However, viral URIs from the influenza virus (flu virus) can last 14-18 days and are typically more severe. Symptoms may include:   Runny or stuffy (congested) nose.   Sneezing.   Cough.   Sore throat.   Headache.   Fatigue.   Fever.   Loss of appetite.   Pain in your forehead, behind your eyes, and over your cheekbones (sinus pain).  Muscle aches.  DIAGNOSIS  Your health care provider may diagnose a URI by:  Physical exam.  Tests to check that your symptoms are not due to  another condition such as:  Strep throat.  Sinusitis.  Pneumonia.  Asthma. TREATMENT  A URI goes away on its own with time. It cannot be cured with medicines, but medicines may be prescribed or recommended to relieve symptoms. Medicines may help:  Reduce your fever.  Reduce your cough.  Relieve nasal congestion. HOME CARE INSTRUCTIONS   Take medicines only as directed by your health care provider.   Gargle warm saltwater or take cough drops to comfort your throat as directed by your health care provider.  Use a warm mist humidifier or inhale steam from a shower to increase air moisture. This may make it easier to breathe.  Drink enough fluid to keep your urine clear or pale yellow.   Eat soups and other clear broths and maintain good nutrition.   Rest as needed.   Return to work when your temperature has returned to normal or as your health care provider advises. You may need to stay home longer to avoid infecting others. You can also use a face mask and careful hand washing to prevent spread of the virus.  Increase the usage of your inhaler if you have asthma.   Do not use any tobacco products, including cigarettes, chewing tobacco, or electronic cigarettes. If you need help quitting, ask your health care provider. PREVENTION  The best way to protect yourself from getting a cold is to practice good hygiene.   Avoid oral or hand contact with people with cold   symptoms.   Wash your hands often if contact occurs.  There is no clear evidence that vitamin C, vitamin E, echinacea, or exercise reduces the chance of developing a cold. However, it is always recommended to get plenty of rest, exercise, and practice good nutrition.  SEEK MEDICAL CARE IF:   You are getting worse rather than better.   Your symptoms are not controlled by medicine.   You have chills.  You have worsening shortness of breath.  You have brown or red mucus.  You have yellow or brown nasal  discharge.  You have pain in your face, especially when you bend forward.  You have a fever.  You have swollen neck glands.  You have pain while swallowing.  You have white areas in the back of your throat. SEEK IMMEDIATE MEDICAL CARE IF:   You have severe or persistent:  Headache.  Ear pain.  Sinus pain.  Chest pain.  You have chronic lung disease and any of the following:  Wheezing.  Prolonged cough.  Coughing up blood.  A change in your usual mucus.  You have a stiff neck.  You have changes in your:  Vision.  Hearing.  Thinking.  Mood. MAKE SURE YOU:   Understand these instructions.  Will watch your condition.  Will get help right away if you are not doing well or get worse.   This information is not intended to replace advice given to you by your health care provider. Make sure you discuss any questions you have with your health care provider.   Document Released: 10/30/2000 Document Revised: 09/20/2014 Document Reviewed: 08/11/2013 Elsevier Interactive Patient Education 2016 [employer] Inc[employer]  

## 2015-04-17 ENCOUNTER — Encounter: Payer: Self-pay | Admitting: Medical Oncology

## 2015-04-17 ENCOUNTER — Emergency Department
Admission: EM | Admit: 2015-04-17 | Discharge: 2015-04-17 | Disposition: A | Discharge status: Home / Self Care | Payer: Self-pay | Attending: Emergency Medicine | Admitting: Emergency Medicine

## 2015-04-17 DIAGNOSIS — F17200 Nicotine dependence, unspecified, uncomplicated: Secondary | ICD-10-CM | POA: Insufficient documentation

## 2015-04-17 DIAGNOSIS — M7731 Calcaneal spur, right foot: Secondary | ICD-10-CM | POA: Insufficient documentation

## 2015-04-17 DIAGNOSIS — Z792 Long term (current) use of antibiotics: Secondary | ICD-10-CM | POA: Insufficient documentation

## 2015-04-17 DIAGNOSIS — M722 Plantar fascial fibromatosis: Secondary | ICD-10-CM | POA: Insufficient documentation

## 2015-04-17 DIAGNOSIS — Z79899 Other long term (current) drug therapy: Secondary | ICD-10-CM | POA: Insufficient documentation

## 2015-04-17 DIAGNOSIS — I10 Essential (primary) hypertension: Secondary | ICD-10-CM | POA: Insufficient documentation

## 2015-04-17 DIAGNOSIS — Z880 Allergy status to penicillin: Secondary | ICD-10-CM | POA: Insufficient documentation

## 2015-04-17 MED ORDER — PREDNISONE 20 MG PO TABS
10.0000 mg | ORAL_TABLET | Freq: Two times a day (BID) | ORAL | Status: DC
Start: 2015-04-17 — End: 2015-07-20

## 2015-04-17 MED ORDER — LISINOPRIL-HYDROCHLOROTHIAZIDE 20-25 MG PO TABS: 1.0000 | ORAL_TABLET | Freq: Every day | ORAL | Status: DC

## 2015-04-17 MED ORDER — TRAMADOL HCL 50 MG PO TABS: 50.0000 mg | ORAL_TABLET | Freq: Two times a day (BID) | ORAL | Status: DC

## 2015-04-17 NOTE — ED Notes (Signed)
Pt reports that he has been having pain to his rt heel for a while but today pain worsened, pt ambulatory.

## 2015-04-17 NOTE — Discharge Instructions (Signed)
Heel Spur A heel spur is a bony growth that forms on the bottom of your heel bone (calcaneus). Heel spurs are common and do not always cause pain. However, heel spurs often cause inflammation in the strong band of tissue that runs underneath the bone of your foot (plantar fascia). When this happens, you may feel pain on the bottom of your foot, near your heel.  CAUSES  The cause of heel spurs is not completely understood. They may be caused by pressure on the heel. Or, they may stem from the muscle attachments (tendons) near the spur pulling on the heel.  RISK FACTORS You may be at risk for a heel spur if you:  Are older than 40.  Are overweight.  Have wear and tear arthritis (osteoarthritis).  Have plantar fascia inflammation. SIGNS AND SYMPTOMS  Some people have heel spurs but no symptoms. If you do have symptoms, they may include:   Pain in the bottom of your heel.  Pain that is worse when you first get out of bed.  Pain that gets worse after walking or standing. DIAGNOSIS  Your health care provider may diagnose a heel spur based on your symptoms and a physical exam. You may also have an X-ray of your foot to check for a bony growth coming from the calcaneus.  TREATMENT Treatment aims to relieve the pain from the heel spur. This may include:  Stretching exercises.  Losing weight.  Wearing specific shoes, inserts, or orthotics for comfort and support.  Wearing splints at night to properly position your feet.  Taking over-the-counter medicine to relieve pain.  Being treated with high-intensity sound waves to break up the heel spur (extracorporeal shock wave therapy).  Getting steroid injections in your heel to reduce swelling and ease pain.  Having surgery if your heel spur causes long-term (chronic) pain. HOME CARE INSTRUCTIONS   Take medicines only as directed by your health care provider.  Ask your health care provider if you should use ice or cold packs on the  painful areas of your heel or foot.  Avoid activities that cause you pain until you recover or as directed by your health care provider.  Stretch before exercising or being physically active.  Wear supportive shoes that fit well as directed by your health care provider. You might need to buy new shoes. Wearing old shoes or shoes that do not fit correctly may not provide the support that you need.  Lose weight if your health care provider thinks you should. This can relieve pressure on your foot that may be causing pain and discomfort. SEEK MEDICAL CARE IF:   Your pain continues or gets worse.   This information is not intended to replace advice given to you by your health care provider. Make sure you discuss any questions you have with your health care provider.   Document Released: 06/12/2005 Document Revised: 05/27/2014 Document Reviewed: 07/07/2013 Elsevier Interactive Patient Education 2016 Elsevier Inc[employer]  Plantar Fasciitis With Rehab The plantar fascia is a fibrous, ligament-like, soft-tissue structure that spans the bottom of the foot. Plantar fasciitis, also called heel spur syndrome, is a condition that causes pain in the foot due to inflammation of the tissue. SYMPTOMS   Pain and tenderness on the underneath side of the foot.  Pain that worsens with standing or walking. CAUSES  Plantar fasciitis is caused by irritation and injury to the plantar fascia on the underneath side of the foot. Common mechanisms of injury include:  Direct trauma to  bottom of the foot.  Damage to a small nerve that runs under the foot where the main fascia attaches to the heel bone.  Stress placed on the plantar fascia due to bone spurs. RISK INCREASES WITH:   Activities that place stress on the plantar fascia (running, jumping, pivoting, or cutting).  Poor strength and flexibility.  Improperly fitted shoes.  Tight calf muscles.  Flat feet.  Failure to warm-up properly before  activity.  Obesity. PREVENTION  Warm up and stretch properly before activity.  Allow for adequate recovery between workouts.  Maintain physical fitness:  Strength, flexibility, and endurance.  Cardiovascular fitness.  Maintain a health body weight.  Avoid stress on the plantar fascia.  Wear properly fitted shoes, including arch supports for individuals who have flat feet. PROGNOSIS  If treated properly, then the symptoms of plantar fasciitis usually resolve without surgery. However, occasionally surgery is necessary. RELATED COMPLICATIONS   Recurrent symptoms that may result in a chronic condition.  Problems of the lower back that are caused by compensating for the injury, such as limping.  Pain or weakness of the foot during push-off following surgery.  Chronic inflammation, scarring, and partial or complete fascia tear, occurring more often from repeated injections. TREATMENT  Treatment initially involves the use of ice and medication to help reduce pain and inflammation. The use of strengthening and stretching exercises may help reduce pain with activity, especially stretches of the Achilles tendon. These exercises may be performed at home or with a therapist. Your caregiver may recommend that you use heel cups of arch supports to help reduce stress on the plantar fascia. Occasionally, corticosteroid injections are given to reduce inflammation. If symptoms persist for greater than 6 months despite non-surgical (conservative), then surgery may be recommended.  MEDICATION   If pain medication is necessary, then nonsteroidal anti-inflammatory medications, such as aspirin and ibuprofen, or other minor pain relievers, such as acetaminophen, are often recommended.  Do not take pain medication within 7 days before surgery.  Prescription pain relievers may be given if deemed necessary by your caregiver. Use only as directed and only as much as you need.  Corticosteroid injections  may be given by your caregiver. These injections should be reserved for the most serious cases, because they may only be given a certain number of times. HEAT AND COLD  Cold treatment (icing) relieves pain and reduces inflammation. Cold treatment should be applied for 10 to 15 minutes every 2 to 3 hours for inflammation and pain and immediately after any activity that aggravates your symptoms. Use ice packs or massage the area with a piece of ice (ice massage).  Heat treatment may be used prior to performing the stretching and strengthening activities prescribed by your caregiver, physical therapist, or athletic trainer. Use a heat pack or soak the injury in warm water. SEEK IMMEDIATE MEDICAL CARE IF:  Treatment seems to offer no benefit, or the condition worsens.  Any medications produce adverse side effects. EXERCISES RANGE OF MOTION (ROM) AND STRETCHING EXERCISES - Plantar Fasciitis (Heel Spur Syndrome) These exercises may help you when beginning to rehabilitate your injury. Your symptoms may resolve with or without further involvement from your physician, physical therapist or athletic trainer. While completing these exercises, remember:   Restoring tissue flexibility helps normal motion to return to the joints. This allows healthier, less painful movement and activity.  An effective stretch should be held for at least 30 seconds.  A stretch should never be painful. You should only feel a  gentle lengthening or release in the stretched tissue. RANGE OF MOTION - Toe Extension, Flexion  Sit with your right / left leg crossed over your opposite knee.  Grasp your toes and gently pull them back toward the top of your foot. You should feel a stretch on the bottom of your toes and/or foot.  Hold this stretch for __________ seconds.  Now, gently pull your toes toward the bottom of your foot. You should feel a stretch on the top of your toes and or foot.  Hold this stretch for __________  seconds. Repeat __________ times. Complete this stretch __________ times per day.  RANGE OF MOTION - Ankle Dorsiflexion, Active Assisted  Remove shoes and sit on a chair that is preferably not on a carpeted surface.  Place right / left foot under knee. Extend your opposite leg for support.  Keeping your heel down, slide your right / left foot back toward the chair until you feel a stretch at your ankle or calf. If you do not feel a stretch, slide your bottom forward to the edge of the chair, while still keeping your heel down.  Hold this stretch for __________ seconds. Repeat __________ times. Complete this stretch __________ times per day.  STRETCH - Gastroc, Standing  Place hands on wall.  Extend right / left leg, keeping the front knee somewhat bent.  Slightly point your toes inward on your back foot.  Keeping your right / left heel on the floor and your knee straight, shift your weight toward the wall, not allowing your back to arch.  You should feel a gentle stretch in the right / left calf. Hold this position for __________ seconds. Repeat __________ times. Complete this stretch __________ times per day. STRETCH - Soleus, Standing  Place hands on wall.  Extend right / left leg, keeping the other knee somewhat bent.  Slightly point your toes inward on your back foot.  Keep your right / left heel on the floor, bend your back knee, and slightly shift your weight over the back leg so that you feel a gentle stretch deep in your back calf.  Hold this position for __________ seconds. Repeat __________ times. Complete this stretch __________ times per day. STRETCH - Gastrocsoleus, Standing  Note: This exercise can place a lot of stress on your foot and ankle. Please complete this exercise only if specifically instructed by your caregiver.   Place the ball of your right / left foot on a step, keeping your other foot firmly on the same step.  Hold on to the wall or a rail for  balance.  Slowly lift your other foot, allowing your body weight to press your heel down over the edge of the step.  You should feel a stretch in your right / left calf.  Hold this position for __________ seconds.  Repeat this exercise with a slight bend in your right / left knee. Repeat __________ times. Complete this stretch __________ times per day.  STRENGTHENING EXERCISES - Plantar Fasciitis (Heel Spur Syndrome)  These exercises may help you when beginning to rehabilitate your injury. They may resolve your symptoms with or without further involvement from your physician, physical therapist or athletic trainer. While completing these exercises, remember:   Muscles can gain both the endurance and the strength needed for everyday activities through controlled exercises.  Complete these exercises as instructed by your physician, physical therapist or athletic trainer. Progress the resistance and repetitions only as guided. STRENGTH - Towel Curls  Sit  in a chair positioned on a non-carpeted surface.  Place your foot on a towel, keeping your heel on the floor.  Pull the towel toward your heel by only curling your toes. Keep your heel on the floor.  If instructed by your physician, physical therapist or athletic trainer, add ____________________ at the end of the towel. Repeat __________ times. Complete this exercise __________ times per day. STRENGTH - Ankle Inversion  Secure one end of a rubber exercise band/tubing to a fixed object (table, pole). Loop the other end around your foot just before your toes.  Place your fists between your knees. This will focus your strengthening at your ankle.  Slowly, pull your big toe up and in, making sure the band/tubing is positioned to resist the entire motion.  Hold this position for __________ seconds.  Have your muscles resist the band/tubing as it slowly pulls your foot back to the starting position. Repeat __________ times. Complete this  exercises __________ times per day.    This information is not intended to replace advice given to you by your health care provider. Make sure you discuss any questions you have with your health care provider.   Document Released: 05/06/2005 Document Revised: 09/20/2014 Document Reviewed: 08/18/2008 Elsevier Interactive Patient Education 2016 [employer].   Follow-up with Dr. Ether Griffins for continued symptoms. See a provider at [facility] for blood pressure management.

## 2015-04-17 NOTE — ED Provider Notes (Signed)
[facility] Emergency Department Provider Note ____________________________________________  Time seen: 2018  I have reviewed the triage vital signs and the nursing notes.  HISTORY  Chief Complaint  Foot Pain  HPI Harold Owens is a 44 y.o. male to the ED for evaluation of right heel and plantar surface foot pain for the last few days. He describes symptoms are worse at work, which has prompted him to attempt to treat his symptoms which shoe inserts. Also notes pain to the bottom of the left foot when he steps out of bed in the morning. He has not been evaluated for his symptoms, and notes that they have been intermittent over the last several months to years. He also has a secondary request for a refill of his blood pressure medicine.  Past Medical History  Diagnosis Date  . Hypertension   . COPD (chronic obstructive pulmonary disease) (HCC)     There are no active problems to display for this patient.   Past Surgical History  Procedure Laterality Date  . Tonsillectomy      Current Outpatient Rx  Name  Route  Sig  Dispense  Refill  . azithromycin (ZITHROMAX Z-PAK) 250 MG tablet      Take 2 tablets (500 mg) on  Day 1,  followed by 1 tablet (250 mg) once daily on Days 2 through 5.   6 each   0   . benzonatate (TESSALON PERLES) 100 MG capsule   Oral   Take 1 capsule (100 mg total) by mouth 3 (three) times daily as needed for cough (Take 1-2 per dose).   30 capsule   0   . fexofenadine-pseudoephedrine (ALLEGRA-D) 60-120 MG per tablet   Oral   Take 1 tablet by mouth 2 (two) times daily.   30 tablet   0   . fluticasone (FLONASE) 50 MCG/ACT nasal spray   Each Nare   Place 1 spray into both nostrils daily.   16 g   0   . gentamicin (GARAMYCIN) 0.3 % ophthalmic solution   Right Eye   Place 2 drops into the right eye every 4 (four) hours.   5 mL   0   . guaiFENesin-codeine 100-10 MG/5ML syrup   Oral   Take 10 mLs by mouth every 4  (four) hours as needed for cough.   180 mL   0   . hydrochlorothiazide (MICROZIDE) 12.5 MG capsule   Oral   Take 12.5 mg by mouth daily.         Marland Kitchen HYDROcodone-acetaminophen (NORCO/VICODIN) 5-325 MG per tablet   Oral   Take 1 tablet by mouth every 4 (four) hours as needed for moderate pain.   20 tablet   0   . ibuprofen (ADVIL,MOTRIN) 800 MG tablet   Oral   Take 1 tablet (800 mg total) by mouth every 8 (eight) hours as needed.   30 tablet   0   . lisinopril-hydrochlorothiazide (PRINZIDE,ZESTORETIC) 20-25 MG tablet   Oral   Take 1 tablet by mouth daily.         Marland Kitchen lisinopril-hydrochlorothiazide (PRINZIDE,ZESTORETIC) 20-25 MG tablet   Oral   Take 1 tablet by mouth daily.   30 tablet   0   . predniSONE (DELTASONE) 20 MG tablet   Oral   Take 0.5 tablets (10 mg total) by mouth 2 (two) times daily with a meal.   10 tablet   0   . sulfamethoxazole-trimethoprim (BACTRIM DS,SEPTRA DS) 800-160 MG per tablet  Oral   Take 1 tablet by mouth 2 (two) times daily.   20 tablet   0   . sulfamethoxazole-trimethoprim (BACTRIM DS,SEPTRA DS) 800-160 MG per tablet   Oral   Take 1 tablet by mouth 2 (two) times daily.   20 tablet   0   . traMADol (ULTRAM) 50 MG tablet   Oral   Take 1 tablet (50 mg total) by mouth 2 (two) times daily.   10 tablet   0    Allergies Penicillins  Family History  Problem Relation Age of Onset  . Cancer Mother   . Diabetes Sister   . Hypertension Sister   . Hypertension Brother     Social History Social History  Substance Use Topics  . Smoking status: Current Every Day Smoker -- 1.00 packs/day  . Smokeless tobacco: None  . Alcohol Use: No   Review of Systems  Constitutional: Negative for fever. Eyes: Negative for visual changes. ENT: Negative for sore throat. Cardiovascular: Negative for chest pain. Respiratory: Negative for shortness of breath. Gastrointestinal: Negative for abdominal pain, vomiting and diarrhea. Genitourinary:  Negative for dysuria. Musculoskeletal: Negative for back pain. Foot pain as above. Skin: Negative for rash. Neurological: Negative for headaches, focal weakness or numbness. ____________________________________________  PHYSICAL EXAM:  VITAL SIGNS: ED Triage Vitals  Enc Vitals Group     BP 04/17/15 1900 191/101 mmHg     Pulse Rate 04/17/15 1900 98     Resp 04/17/15 1900 18     Temp 04/17/15 1900 98.1 F (36.7 C)     Temp Source 04/17/15 1900 Oral     SpO2 04/17/15 1900 99 %     Weight 04/17/15 1900 300 lb (136.079 kg)     Height 04/17/15 1900 5\' 6"  (1.676 m)     Head Cir --      Peak Flow --      Pain Score 04/17/15 1901 10     Pain Loc --      Pain Edu? --      Excl. in GC? --    Constitutional: Alert and oriented. Well appearing and in no distress. Head: Normocephalic and atraumatic.      Eyes: Conjunctivae are normal. PERRL. Normal extraocular movements      Ears: Canals clear. TMs intact bilaterally.   Nose: No congestion/rhinorrhea.   Mouth/Throat: Mucous membranes are moist.   Neck: Supple. No thyromegaly. Hematological/Lymphatic/Immunological: No cervical lymphadenopathy. Cardiovascular: Normal rate, regular rhythm.  Respiratory: Normal respiratory effort. No wheezes/rales/rhonchi. Gastrointestinal: Soft and nontender. No distention. Musculoskeletal: Nontender with normal range of motion in all extremities.  Neurologic:  Normal gait without ataxia. Normal speech and language. No gross focal neurologic deficits are appreciated. Skin:  Skin is warm, dry and intact. No rash noted. Psychiatric: Mood and affect are normal. Patient exhibits appropriate insight and judgment. ____________________________________________  INITIAL IMPRESSION / ASSESSMENT AND PLAN / ED COURSE  Right foot with heel pain likely secondary to the plantar heel spur, as well as plantar fasciitis. He is referred to Dr. Ether GriffinsFowler for ongoing evaluation and management of his right foot and heel  pain. He is provided with a prescription for lisinopril, prednisone, and Ultram for acute pain. ____________________________________________  FINAL CLINICAL IMPRESSION(S) / ED DIAGNOSES  Final diagnoses:  Heel spur, right  Plantar fasciitis of right foot      Lissa HoardJenise V Bacon Makisha Marrin, PA-C 04/17/15 2347  Rockne MenghiniAnne-Caroline Norman, MD 04/18/15 1511

## 2015-07-03 ENCOUNTER — Emergency Department
Admission: EM | Admit: 2015-07-03 | Discharge: 2015-07-03 | Disposition: A | Discharge status: Home / Self Care | Payer: BLUE CROSS/BLUE SHIELD | Attending: Emergency Medicine | Admitting: Emergency Medicine

## 2015-07-03 DIAGNOSIS — M79671 Pain in right foot: Secondary | ICD-10-CM

## 2015-07-03 DIAGNOSIS — F17200 Nicotine dependence, unspecified, uncomplicated: Secondary | ICD-10-CM | POA: Insufficient documentation

## 2015-07-03 DIAGNOSIS — Z880 Allergy status to penicillin: Secondary | ICD-10-CM | POA: Insufficient documentation

## 2015-07-03 DIAGNOSIS — E669 Obesity, unspecified: Secondary | ICD-10-CM | POA: Insufficient documentation

## 2015-07-03 DIAGNOSIS — Z7952 Long term (current) use of systemic steroids: Secondary | ICD-10-CM | POA: Diagnosis not present

## 2015-07-03 DIAGNOSIS — Z79899 Other long term (current) drug therapy: Secondary | ICD-10-CM | POA: Diagnosis not present

## 2015-07-03 DIAGNOSIS — I10 Essential (primary) hypertension: Secondary | ICD-10-CM | POA: Diagnosis not present

## 2015-07-03 DIAGNOSIS — M79672 Pain in left foot: Secondary | ICD-10-CM | POA: Diagnosis not present

## 2015-07-03 DIAGNOSIS — Z792 Long term (current) use of antibiotics: Secondary | ICD-10-CM | POA: Insufficient documentation

## 2015-07-03 MED ORDER — CYCLOBENZAPRINE HCL 5 MG PO TABS: 5.0000 mg | ORAL_TABLET | Freq: Three times a day (TID) | ORAL | Status: AC | PRN

## 2015-07-03 MED ORDER — MELOXICAM 15 MG PO TABS: 15.0000 mg | ORAL_TABLET | Freq: Every day | ORAL | Status: DC

## 2015-07-03 NOTE — ED Notes (Signed)
Pt presents to ED with c/o bilateral foot pain x2 months. Pt states being seen for the same several times for the same w/o results. 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 Pt reports the pain sometimes wakes him up at night, with radiation into his ankle. Pt reports being told to follow up with orthopedics, but has not done so.

## 2015-07-03 NOTE — Discharge Instructions (Signed)

## 2015-07-03 NOTE — ED Provider Notes (Signed)
[facility] Emergency Department Provider Note  ____________________________________________  Time seen: Approximately 8:48 PM  I have reviewed the triage vital signs and the nursing notes.   HISTORY  Chief Complaint Foot Pain    HPI Harold Owens is a 45 y.o. male , NAD, presents with the emergency department with ongoing bilateral foot pain. Has had workup in the past but has not come with any results. Was advised to see orthopedics but unfortunately is uninsured and has been unable to do so. Does not have a primary care provider for follow-up. He eats his feet spasm at night causing severe pain. Decreases his quality of sleep. Has not had any injuries or traumas to his feet. No open wounds or lesions. Can get occasional tingling in the feet. Neither any redness, swelling. Notes he has been tested for diabetes and was negative.   Past Medical History  Diagnosis Date  . Hypertension   . COPD (chronic obstructive pulmonary disease) (HCC)     There are no active problems to display for this patient.   Past Surgical History  Procedure Laterality Date  . Tonsillectomy      Current Outpatient Rx  Name  Route  Sig  Dispense  Refill  . azithromycin (ZITHROMAX Z-PAK) 250 MG tablet      Take 2 tablets (500 mg) on  Day 1,  followed by 1 tablet (250 mg) once daily on Days 2 through 5.   6 each   0   . benzonatate (TESSALON PERLES) 100 MG capsule   Oral   Take 1 capsule (100 mg total) by mouth 3 (three) times daily as needed for cough (Take 1-2 per dose).   30 capsule   0   . cyclobenzaprine (FLEXERIL) 5 MG tablet   Oral   Take 1 tablet (5 mg total) by mouth every 8 (eight) hours as needed for muscle spasms.   30 tablet   1   . fexofenadine-pseudoephedrine (ALLEGRA-D) 60-120 MG per tablet   Oral   Take 1 tablet by mouth 2 (two) times daily.   30 tablet   0   . fluticasone (FLONASE) 50 MCG/ACT nasal spray   Each Nare   Place 1 spray into  both nostrils daily.   16 g   0   . gentamicin (GARAMYCIN) 0.3 % ophthalmic solution   Right Eye   Place 2 drops into the right eye every 4 (four) hours.   5 mL   0   . guaiFENesin-codeine 100-10 MG/5ML syrup   Oral   Take 10 mLs by mouth every 4 (four) hours as needed for cough.   180 mL   0   . hydrochlorothiazide (MICROZIDE) 12.5 MG capsule   Oral   Take 12.5 mg by mouth daily.         Marland Kitchen HYDROcodone-acetaminophen (NORCO/VICODIN) 5-325 MG per tablet   Oral   Take 1 tablet by mouth every 4 (four) hours as needed for moderate pain.   20 tablet   0   . ibuprofen (ADVIL,MOTRIN) 800 MG tablet   Oral   Take 1 tablet (800 mg total) by mouth every 8 (eight) hours as needed.   30 tablet   0   . lisinopril-hydrochlorothiazide (PRINZIDE,ZESTORETIC) 20-25 MG tablet   Oral   Take 1 tablet by mouth daily.         Marland Kitchen lisinopril-hydrochlorothiazide (PRINZIDE,ZESTORETIC) 20-25 MG tablet   Oral   Take 1 tablet by mouth daily.   30  tablet   0   . meloxicam (MOBIC) 15 MG tablet   Oral   Take 1 tablet (15 mg total) by mouth daily.   30 tablet   0   . predniSONE (DELTASONE) 20 MG tablet   Oral   Take 0.5 tablets (10 mg total) by mouth 2 (two) times daily with a meal.   10 tablet   0   . sulfamethoxazole-trimethoprim (BACTRIM DS,SEPTRA DS) 800-160 MG per tablet   Oral   Take 1 tablet by mouth 2 (two) times daily.   20 tablet   0   . sulfamethoxazole-trimethoprim (BACTRIM DS,SEPTRA DS) 800-160 MG per tablet   Oral   Take 1 tablet by mouth 2 (two) times daily.   20 tablet   0   . traMADol (ULTRAM) 50 MG tablet   Oral   Take 1 tablet (50 mg total) by mouth 2 (two) times daily.   10 tablet   0     Allergies Penicillins  Family History  Problem Relation Age of Onset  . Cancer Mother   . Diabetes Sister   . Hypertension Sister   . Hypertension Brother     Social History Social History  Substance Use Topics  . Smoking status: Current Every Day Smoker --  1.00 packs/day  . Smokeless tobacco: None  . Alcohol Use: No     Review of Systems  Constitutional: No fever/chills, fatigue Eyes: No visual changes.  Cardiovascular: No chest pain. Respiratory: No cough. No shortness of breath. No wheezing.  Gastrointestinal: No abdominal pain.  No nausea, vomiting.  Musculoskeletal: Positive bilateral heel pain. Negative for back pain nor leg pain.  Skin: Negative for rash, skin sores, redness, warmth. Neurological: Negative for headaches, focal weakness or numbness. Occasional tingling in the toes. 10-point ROS otherwise negative.  ____________________________________________   PHYSICAL EXAM:  VITAL SIGNS: ED Triage Vitals  Enc Vitals Group     BP 07/03/15 2015 145/75 mmHg     Pulse Rate 07/03/15 2015 93     Resp 07/03/15 2015 16     Temp 07/03/15 2015 98 F (36.7 C)     Temp Source 07/03/15 2015 Oral     SpO2 07/03/15 2015 95 %     Weight 07/03/15 2015 305 lb (138.347 kg)     Height 07/03/15 2015  (1.727 m)     Head Cir --      Peak Flow --      Pain Score 07/03/15 2017 10     Pain Loc --      Pain Edu? --      Excl. in GC? --     Constitutional: Alert and oriented. Well appearing and in no acute distress. Obese. Eyes: Conjunctivae are normal.  Head: Atraumatic. Cardiovascular:  Good peripheral circulation. Respiratory: Normal respiratory effort without tachypnea or retractions. Musculoskeletal: Tenderness with deep palpation of bilateral heels. No pain with palpation over any portion of the foot or toes bilaterally. No lower extremity tenderness nor edema.  No joint effusions. No tenderness to palpation over the ankles or lower legs.  Neurologic:  Normal speech and language. No gross focal neurologic deficits are appreciated. Sensation grossly intact about the feet.  Skin:  Skin about the feet are calloused with numerous dry cracks in the skin. No lesions, open wounds.  Psychiatric: Mood and affect are normal. Speech and  behavior are normal. Patient exhibits appropriate insight and judgement.   ____________________________________________   LABS  None  ____________________________________________  EKG  None ____________________________________________  RADIOLOGY  None ____________________________________________    PROCEDURES  Procedure(s) performed: None   Medications - No data to display   ____________________________________________   INITIAL IMPRESSION / ASSESSMENT AND PLAN / ED COURSE  Patient's diagnosis is consistent with foot pain potentially caused by plantar fasciitis. Patient will be discharged home with prescriptions for muscle relaxers and anti-inflammatory. Patient is to follow up with [facility] if symptoms persist past this treatment course as well as to establish care. Patient is given ED precautions to return to the ED for any worsening or new symptoms.    ____________________________________________  FINAL CLINICAL IMPRESSION(S) / ED DIAGNOSES  Final diagnoses:  Foot pain, bilateral      NEW MEDICATIONS STARTED DURING THIS VISIT:  New Prescriptions   CYCLOBENZAPRINE (FLEXERIL) 5 MG TABLET    Take 1 tablet (5 mg total) by mouth every 8 (eight) hours as needed for muscle spasms.   MELOXICAM (MOBIC) 15 MG TABLET    Take 1 tablet (15 mg total) by mouth daily.         Hope Pigeon, PA-C 07/03/15 2110  Sharman Cheek, MD 07/03/15 2253

## 2015-07-20 ENCOUNTER — Encounter: Payer: BLUE CROSS/BLUE SHIELD | Attending: Family Medicine | Admitting: *Deleted

## 2015-07-20 ENCOUNTER — Encounter: Payer: Self-pay | Admitting: *Deleted

## 2015-07-20 VITALS — BP 130/82 | Ht 68.0 in | Wt 317.2 lb

## 2015-07-20 DIAGNOSIS — E119 Type 2 diabetes mellitus without complications: Secondary | ICD-10-CM | POA: Diagnosis present

## 2015-07-20 NOTE — Progress Notes (Signed)
Diabetes Self-Management Education  Visit Type: First/Initial  Appt. Start Time: 1330 Appt. End Time: 1505  07/20/2015  [name], identified by name and date of birth, is a 45 y.o. male with a diagnosis of Diabetes: Type 2.   ASSESSMENT  Blood pressure 130/82, height  (1.727 m), weight 317 lb 3.2 oz (143.881 kg). Body mass index is 48.24 kg/(m^2).      Diabetes Self-Management Education - 07/20/15 1510    Visit Information   Visit Type First/Initial   Initial Visit   Diabetes Type Type 2   Are you currently following a meal plan? No   Are you taking your medications as prescribed? Yes   Date Diagnosed 1 week   Health Coping   How would you rate your overall health? Fair   Psychosocial Assessment   Patient Belief/Attitude about Diabetes Motivated to manage diabetes  "scared"   Self-care barriers None   Self-management support Doctor's office;Family   Patient Concerns Nutrition/Meal planning;Medication;Weight Control;Problem Solving;Healthy Lifestyle;Glycemic Control;Monitoring   Special Needs None   Preferred Learning Style Hands on;Visual   Learning Readiness Contemplating   How often do you need to have someone help you when you read instructions, pamphlets, or other written materials from your doctor or pharmacy? 1 - Never   What is the last grade level you completed in school? 12   Complications   Last HgB A1C per patient/outside source 6.7 %  07/11/15   How often do you check your blood sugar? 0 times/day (not testing)  Provided One Touch Verio and instructed on use. BG upon return demonstration was 107 mg/dL at 0:98 pm - 3 hrs pp.    Have you had a dilated eye exam in the past 12 months? Yes   Have you had a dental exam in the past 12 months? Yes   Are you checking your feet? Yes   How many days per week are you checking your feet? 3   Dietary Intake   Breakfast skips or eats steak biscuit  works 7 pm to 7 am   Snack (morning) chips or candy   Lunch  oodles and noodles; fried chicken wings, roll, rice gravy, mac n cheese   Dinner meat, potatoes, cabbage   Snack (evening) cheese crackers   Beverage(s) water, regular soda, sugar sweetened tea and coffee   Exercise   Exercise Type ADL's   Patient Education   Previous Diabetes Education No   Disease state  Definition of diabetes, type 1 and 2, and the diagnosis of diabetes   Nutrition management  Role of diet in the treatment of diabetes and the relationship between the three main macronutrients and blood glucose level   Physical activity and exercise  Role of exercise on diabetes management, blood pressure control and cardiac health.   Monitoring Taught/evaluated SMBG meter.;Purpose and frequency of SMBG.;Identified appropriate SMBG and/or A1C goals.   Chronic complications Relationship between chronic complications and blood glucose control   Psychosocial adjustment Identified and addressed patients feelings and concerns about diabetes   Personal strategies to promote health Review risk of smoking and offered smoking cessation   Individualized Goals (developed by patient)   Reducing Risk Improve blood sugars Decrease medications Prevent diabetes complications Lose weight Lead a healthier lifestyle Become more fit Quit smoking   Outcomes   Expected Outcomes Demonstrated interest in learning. Expect positive outcomes      Individualized Plan for Diabetes Self-Management Training:   Learning Objective:  Patient will have a greater  understanding of diabetes self-management. Patient education plan is to attend individual and/or group sessions per assessed needs and concerns.   Plan:   Patient Instructions  Check blood sugars 2 x day before breakfast and 2 hrs after supper  3-4 x week Exercise: walk as tolerated or do chair exercises (gradually increase to 150 minutes/week)  Eat 3 meals day,  1-2  snacks a day Space meals 4-6 hours apart Don't skip meals Avoid sugar sweetened  drinks (soda, tea, coffee) Limit your fried foods and desserts/sweets Bring blood sugar records to the next class Call your doctor for a prescription for:  1. Meter strips (type) One Touch Verio checking  3-4 times per week  2. Lancets (type) One Touch Delica checking  3-4 times per week  Expected Outcomes:  Demonstrated interest in learning. Expect positive outcomes  Education material provided: General Meal Planning Guidelines Simple Meal Plan Meter - One Touch Verio  If problems or questions, patient to contact team via:   Sharion Settler, RN, CCM, CDE [phone]  Future DSME appointment:   Pt to check his work calendar and call back to schedule classes.

## 2015-07-20 NOTE — Patient Instructions (Addendum)
Check blood sugars 2 x day before breakfast and 2 hrs after supper  3-4 x week Exercise: walk as tolerated or do chair exercises (gradually increase to 150 minutes/week)  Eat 3 meals day,  1-2  snacks a day Space meals 4-6 hours apart Don't skip meals Avoid sugar sweetened drinks (soda, tea, coffee) Limit your fried foods and desserts/sweets Bring blood sugar records to the next class Call your doctor for a prescription for:  1. Meter strips (type) One Touch Verio checking  3-4 times per week  2. Lancets (type) One Touch Delica checking  3-4 times per week Call to schedule classes

## 2015-08-31 ENCOUNTER — Encounter: Payer: Self-pay | Admitting: *Deleted

## 2016-02-22 ENCOUNTER — Emergency Department
Admission: EM | Admit: 2016-02-22 | Discharge: 2016-02-22 | Disposition: A | Discharge status: Home / Self Care | Payer: 59 | Attending: Emergency Medicine | Admitting: Emergency Medicine

## 2016-02-22 ENCOUNTER — Encounter: Payer: Self-pay | Admitting: Emergency Medicine

## 2016-02-22 DIAGNOSIS — R109 Unspecified abdominal pain: Secondary | ICD-10-CM | POA: Insufficient documentation

## 2016-02-22 DIAGNOSIS — R112 Nausea with vomiting, unspecified: Secondary | ICD-10-CM | POA: Diagnosis not present

## 2016-02-22 DIAGNOSIS — Z79899 Other long term (current) drug therapy: Secondary | ICD-10-CM | POA: Insufficient documentation

## 2016-02-22 DIAGNOSIS — E119 Type 2 diabetes mellitus without complications: Secondary | ICD-10-CM | POA: Insufficient documentation

## 2016-02-22 DIAGNOSIS — F17210 Nicotine dependence, cigarettes, uncomplicated: Secondary | ICD-10-CM | POA: Diagnosis not present

## 2016-02-22 DIAGNOSIS — I10 Essential (primary) hypertension: Secondary | ICD-10-CM | POA: Diagnosis not present

## 2016-02-22 DIAGNOSIS — R1031 Right lower quadrant pain: Secondary | ICD-10-CM | POA: Diagnosis present

## 2016-02-22 DIAGNOSIS — J449 Chronic obstructive pulmonary disease, unspecified: Secondary | ICD-10-CM | POA: Diagnosis not present

## 2016-02-22 LAB — LIPASE, BLOOD: Lipase: 19 U/L (ref 11–51)

## 2016-02-22 LAB — CBC
HCT: 48.2 % (ref 40.0–52.0)
Hemoglobin: 16 g/dL (ref 13.0–18.0)
MCH: 30.1 pg (ref 26.0–34.0)
MCHC: 33.2 g/dL (ref 32.0–36.0)
MCV: 90.6 fL (ref 80.0–100.0)
Platelets: 229 10*3/uL (ref 150–440)
RBC: 5.32 MIL/uL (ref 4.40–5.90)
RDW: 15.1 % — ABNORMAL HIGH (ref 11.5–14.5)
WBC: 10.6 10*3/uL (ref 3.8–10.6)

## 2016-02-22 LAB — URINALYSIS COMPLETE WITH MICROSCOPIC (ARMC ONLY)
Bacteria, UA: NONE SEEN
Bilirubin Urine: NEGATIVE
Glucose, UA: NEGATIVE mg/dL
Hgb urine dipstick: NEGATIVE
Ketones, ur: NEGATIVE mg/dL
Leukocytes, UA: NEGATIVE
Nitrite: NEGATIVE
Protein, ur: 30 mg/dL — AB
Specific Gravity, Urine: 1.024 (ref 1.005–1.030)
pH: 5 (ref 5.0–8.0)

## 2016-02-22 LAB — COMPREHENSIVE METABOLIC PANEL
ALT: 46 U/L (ref 17–63)
AST: 30 U/L (ref 15–41)
Albumin: 4.2 g/dL (ref 3.5–5.0)
Alkaline Phosphatase: 57 U/L (ref 38–126)
Anion gap: 7 (ref 5–15)
BUN: 18 mg/dL (ref 6–20)
CO2: 30 mmol/L (ref 22–32)
Calcium: 9.5 mg/dL (ref 8.9–10.3)
Chloride: 103 mmol/L (ref 101–111)
Creatinine, Ser: 0.87 mg/dL (ref 0.61–1.24)
GFR calc Af Amer: >60 mL/min (ref >60)
GFR calc non Af Amer: >60 mL/min (ref >60)
Glucose, Bld: 91 mg/dL (ref 65–99)
Potassium: 3.9 mmol/L (ref 3.5–5.1)
Sodium: 140 mmol/L (ref 135–145)
Total Bilirubin: 0.2 mg/dL — ABNORMAL LOW (ref 0.3–1.2)
Total Protein: 7.5 g/dL (ref 6.5–8.1)

## 2016-02-22 MED ORDER — RANITIDINE HCL 150 MG PO TABS: 150.0000 mg | ORAL_TABLET | Freq: Two times a day (BID) | ORAL | 1 refills | Status: DC

## 2016-02-22 MED ORDER — FAMOTIDINE 20 MG PO TABS
40.0000 mg | ORAL_TABLET | Freq: Once | ORAL | Status: AC
  Administered 2016-02-22: 40 mg via ORAL
  Filled 2016-02-22: qty 2

## 2016-02-22 NOTE — ED Triage Notes (Signed)
C/O headaches and abdominal pain since Monday.  No N/V since Tuesday. Also c/o sinus drainage.

## 2016-02-22 NOTE — ED Provider Notes (Signed)
[facility] Emergency Department Provider Note  ____________________________________________   First MD Initiated Contact with Patient 02/22/16 2047     (approximate)  I have reviewed the triage vital signs and the nursing notes.   HISTORY  Chief Complaint Abdominal Pain   HPI Harold Owens is a 45 y.o. male with a history of COPD, diabetes and hypertension was presenting to the emergency department with 1 week of abdominal pain. He says that he had had abdominal pain starting this Monday. He says that the pain starts in his right lower quadrant and then shoots upward. He says that the pain is sharp and only last 2-3 seconds at a time. He has had nausea and vomiting but last vomited this past Tuesday. Denies any diarrhea. Denies any fever, cough or runny nose. No known sick contacts. He denies any pain at this time but says he has also developed a mild headache which is diffuse.   Past Medical History:  Diagnosis Date  . COPD (chronic obstructive pulmonary disease) (HCC)   . Diabetes mellitus without complication (HCC)   . Hypertension     There are no active problems to display for this patient.   Past Surgical History:  Procedure Laterality Date  . TONSILLECTOMY      Prior to Admission medications   Medication Sig Start Date End Date Taking? Authorizing Provider  albuterol (PROVENTIL) (5 MG/ML) 0.5% nebulizer solution Inhale 2.5 mg into the lungs every 4 (four) hours as needed. 11/30/14 11/30/15  Historical Provider, MD  albuterol (VENTOLIN HFA) 108 (90 Base) MCG/ACT inhaler Inhale 2 puffs into the lungs every 6 (six) hours as needed. 07/11/15 07/10/16  Historical Provider, MD  econazole nitrate 1 % cream Apply 1 application topically 2 (two) times daily. 07/13/15   Historical Provider, MD  fexofenadine-pseudoephedrine (ALLEGRA-D) 60-120 MG per tablet Take 1 tablet by mouth 2 (two) times daily. 02/07/15   Joni Reining, PA-C  fluticasone  (FLONASE) 50 MCG/ACT nasal spray Place 1 spray into both nostrils daily. Patient not taking: Reported on 07/20/2015 02/01/15   Charlesetta Ivory Menshew, PA-C  HYDROcodone-acetaminophen (NORCO/VICODIN) 5-325 MG per tablet Take 1 tablet by mouth every 4 (four) hours as needed for moderate pain. 12/15/14   Tommi Rumps, PA-C  lisinopril-hydrochlorothiazide (PRINZIDE,ZESTORETIC) 10-12.5 MG tablet Take 1 tablet by mouth daily. 07/11/15   Historical Provider, MD  meloxicam (MOBIC) 15 MG tablet Take 1 tablet (15 mg total) by mouth daily. 07/03/15   Jami L Hagler, PA-C  sildenafil (VIAGRA) 100 MG tablet Take 0.5 mg by mouth daily as needed. 11/30/14   Historical Provider, MD    Allergies Penicillins  Family History  Problem Relation Age of Onset  . Cancer Mother   . Diabetes Sister   . Hypertension Sister   . Hypertension Brother     Social History Social History  Substance Use Topics  . Smoking status: Current Every Day Smoker    Packs/day: 1.00    Years: 25.00    Types: Cigarettes  . Smokeless tobacco: Never Used  . Alcohol use No    Review of Systems Constitutional: No fever/chills Eyes: No visual changes. ENT: No sore throat. Cardiovascular: Denies chest pain. Respiratory: Denies shortness of breath. Gastrointestinal:  No diarrhea.  No constipation. Genitourinary: Negative for dysuria. Musculoskeletal: Negative for back pain. Skin: Negative for rash. Neurological: Negative for headaches, focal weakness or numbness.  10-point ROS otherwise negative.  ____________________________________________   PHYSICAL EXAM:  VITAL SIGNS: ED Triage Vitals  Enc Vitals Group     BP 02/22/16 1828 122/72     Pulse Rate 02/22/16 1828 83     Resp 02/22/16 1828 16     Temp 02/22/16 2144 97.7 F (36.5 C)     Temp Source 02/22/16 2144 Oral     SpO2 02/22/16 1828 97 %     Weight 02/22/16 1826 300 lb (136.1 kg)     Height 02/22/16 1826 5\' 6"  (1.676 m)     Head Circumference --      Peak  Flow --      Pain Score 02/22/16 1827 8     Pain Loc --      Pain Edu? --      Excl. in GC? --     Constitutional: Alert and oriented. Well appearing and in no acute distress. Eyes: Conjunctivae are normal. PERRL. EOMI. Head: Atraumatic. Nose: No congestion/rhinnorhea. Mouth/Throat: Mucous membranes are moist.   Neck: No stridor.   Cardiovascular: Normal rate, regular rhythm. Grossly normal heart sounds.   Respiratory: Normal respiratory effort.  No retractions. Lungs CTAB. Gastrointestinal: Soft With mild to moderate right upper quadrant tenderness with a negative Murphy sign. Very minimal right lower quadrant tenderness to palpation. No distention. No CVA tenderness. Musculoskeletal: No lower extremity tenderness nor edema.  No joint effusions. Neurologic:  Normal speech and language. No gross focal neurologic deficits are appreciated. No gait instability. Skin:  Skin is warm, dry and intact. No rash noted. Psychiatric: Mood and affect are normal. Speech and behavior are normal.  ____________________________________________   LABS (all labs ordered are listed, but only abnormal results are displayed)  Labs Reviewed  COMPREHENSIVE METABOLIC PANEL - Abnormal; Notable for the following:       Result Value   Total Bilirubin 0.2 (*)    All other components within normal limits  CBC - Abnormal; Notable for the following:    RDW 15.1 (*)    All other components within normal limits  URINALYSIS COMPLETEWITH MICROSCOPIC ([facility] ONLY) - Abnormal; Notable for the following:    Color, Urine YELLOW (*)    APPearance CLEAR (*)    Protein, ur 30 (*)    Squamous Epithelial / LPF 0-5 (*)    All other components within normal limits  LIPASE, BLOOD   ____________________________________________  EKG   ____________________________________________  RADIOLOGY   ____________________________________________   PROCEDURES  Procedure(s) performed:   Procedures  Critical Care  performed:   ____________________________________________   INITIAL IMPRESSION / ASSESSMENT AND PLAN / ED COURSE  Pertinent labs & imaging results that were available during my care of the patient were reviewed by me and considered in my medical decision making (see chart for details).  I discussed imaging with the patient but the patient says that he would not like imaging at this time because he is pain-free and his labs are very reassuring. He says that he tried an Alka-Seltzer earlier today which caused him to "pass some gas." He says that he felt mild relief after passing gas. He says that he would rather try an antacid and return to the emergency department for any worsening or concerning symptoms. He is aware of the Locations of undiagnosed gallbladder disease as well as appendicitis. However, he still chooses to leave without further workup. I'll be discharging with ranitidine. He will return to the hospital for any worsening or concerning symptoms. He says repeatedly that he can be back to the hospital within 10 minutes. He also has a primary care  doctor's appointment follow-up for this coming Tuesday.   Clinical Course     ____________________________________________   FINAL CLINICAL IMPRESSION(S) / ED DIAGNOSES  Right-sided abdominal pain. Nausea and vomiting.    NEW MEDICATIONS STARTED DURING THIS VISIT:  New Prescriptions   No medications on file     Note:  This document was prepared using Dragon voice recognition software and may include unintentional dictation errors.    Myrna Blazer, MD 02/22/16 2149

## 2016-02-22 NOTE — ED Notes (Signed)

## 2016-02-22 NOTE — ED Notes (Signed)
Pt. States Monday a shooting pain started from RLQ up to LUQ, worse when bending over. Minimal nausea with vomiting and diarrhea. Pt. States diet WNL, no changes, no med changes. Headache developed in lobby.

## 2016-05-22 ENCOUNTER — Ambulatory Visit: Payer: 59 | Admitting: Cardiology

## 2016-08-26 ENCOUNTER — Encounter: Payer: Self-pay | Admitting: Emergency Medicine

## 2016-08-26 ENCOUNTER — Emergency Department
Admission: EM | Admit: 2016-08-26 | Discharge: 2016-08-26 | Disposition: A | Discharge status: Home / Self Care | Payer: 59 | Attending: Emergency Medicine | Admitting: Emergency Medicine

## 2016-08-26 ENCOUNTER — Emergency Department: Payer: 59

## 2016-08-26 DIAGNOSIS — Y9389 Activity, other specified: Secondary | ICD-10-CM | POA: Insufficient documentation

## 2016-08-26 DIAGNOSIS — I10 Essential (primary) hypertension: Secondary | ICD-10-CM | POA: Insufficient documentation

## 2016-08-26 DIAGNOSIS — F17210 Nicotine dependence, cigarettes, uncomplicated: Secondary | ICD-10-CM | POA: Insufficient documentation

## 2016-08-26 DIAGNOSIS — M779 Enthesopathy, unspecified: Secondary | ICD-10-CM | POA: Diagnosis not present

## 2016-08-26 DIAGNOSIS — Y999 Unspecified external cause status: Secondary | ICD-10-CM | POA: Diagnosis not present

## 2016-08-26 DIAGNOSIS — S6992XA Unspecified injury of left wrist, hand and finger(s), initial encounter: Secondary | ICD-10-CM | POA: Diagnosis present

## 2016-08-26 DIAGNOSIS — Z79899 Other long term (current) drug therapy: Secondary | ICD-10-CM | POA: Insufficient documentation

## 2016-08-26 DIAGNOSIS — M778 Other enthesopathies, not elsewhere classified: Secondary | ICD-10-CM

## 2016-08-26 DIAGNOSIS — J449 Chronic obstructive pulmonary disease, unspecified: Secondary | ICD-10-CM | POA: Insufficient documentation

## 2016-08-26 DIAGNOSIS — E119 Type 2 diabetes mellitus without complications: Secondary | ICD-10-CM | POA: Insufficient documentation

## 2016-08-26 DIAGNOSIS — Y929 Unspecified place or not applicable: Secondary | ICD-10-CM | POA: Diagnosis not present

## 2016-08-26 DIAGNOSIS — X503XXA Overexertion from repetitive movements, initial encounter: Secondary | ICD-10-CM | POA: Insufficient documentation

## 2016-08-26 MED ORDER — ETODOLAC 400 MG PO TABS: 400.0000 mg | ORAL_TABLET | Freq: Two times a day (BID) | ORAL | 0 refills | Status: DC

## 2016-08-26 NOTE — Discharge Instructions (Signed)
Wear splint for one week. Follow-up with your primary care doctor if any continued problems. Take etodolac 400 mg twice a day with food. Ice and elevate as needed for inflammation and pain.

## 2016-08-26 NOTE — ED Triage Notes (Signed)
Pt to triage with left wrist pain that radiates down to thumb. Pain started yesterday. 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

## 2016-08-26 NOTE — ED Provider Notes (Signed)
[facility] Emergency Department Provider Note  ____________________________________________   First MD Initiated Contact with Patient 08/26/16 1249     (approximate)  I have reviewed the triage vital signs and the nursing notes.   HISTORY  Chief Complaint Wrist Pain    HPI Harold Owens is a 46 y.o. male complaint of left wrist pain with pain radiating down to his left palm. Patient states that he has range of motion increases his pain. He states that this began yesterday and he took ibuprofen 800 mg once without any relief. 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  He denies any recent injuries.   patient is right-hand dominant. He  rates his pain as a 9/10.   Past Medical History:  Diagnosis Date  . COPD (chronic obstructive pulmonary disease) (HCC)   . Diabetes mellitus without complication (HCC)   . Hypertension     There are no active problems to display for this patient.   Past Surgical History:  Procedure Laterality Date  . TONSILLECTOMY      Prior to Admission medications   Medication Sig Start Date End Date Taking? Authorizing Provider  albuterol (PROVENTIL) (5 MG/ML) 0.5% nebulizer solution Inhale 2.5 mg into the lungs every 4 (four) hours as needed. 11/30/14 11/30/15  Historical Provider, MD  albuterol (VENTOLIN HFA) 108 (90 Base) MCG/ACT inhaler Inhale 2 puffs into the lungs every 6 (six) hours as needed. 07/11/15 07/10/16  Historical Provider, MD  econazole nitrate 1 % cream Apply 1 application topically 2 (two) times daily. 07/13/15   Historical Provider, MD  etodolac (LODINE) 400 MG tablet Take 1 tablet (400 mg total) by mouth 2 (two) times daily. 08/26/16   Tommi Rumps, PA-C  fexofenadine-pseudoephedrine (ALLEGRA-D) 60-120 MG per tablet Take 1 tablet by mouth 2 (two) times daily. 02/07/15   Joni Reining, PA-C  fluticasone (FLONASE) 50 MCG/ACT nasal spray Place 1 spray into  both nostrils daily. Patient not taking: Reported on 07/20/2015 02/01/15   Smith Robert Bacon Menshew, PA-C  lisinopril-hydrochlorothiazide (PRINZIDE,ZESTORETIC) 10-12.5 MG tablet Take 1 tablet by mouth daily. 07/11/15   Historical Provider, MD  sildenafil (VIAGRA) 100 MG tablet Take 0.5 mg by mouth daily as needed. 11/30/14   Historical Provider, MD    Allergies Penicillins  Family History  Problem Relation Age of Onset  . Cancer Mother   . Diabetes Sister   . Hypertension Sister   . Hypertension Brother     Social History Social History  Substance Use Topics  . Smoking status: Current Every Day Smoker    Packs/day: 1.00    Years: 25.00    Types: Cigarettes  . Smokeless tobacco: Never Used  . Alcohol use No    Review of Systems Constitutional: No fever/chills Cardiovascular: Denies chest pain. Respiratory: Denies shortness of breath. Gastrointestinal:   No nausea, no vomiting.  Musculoskeletal:  positive for left wrist pain. Skin: Negative for rash. Neurological: Negative for headaches, focal weakness or numbness. 10-point ROS otherwise negative.  ____________________________________________   PHYSICAL EXAM:  VITAL SIGNS: ED Triage Vitals  Enc Vitals Group     BP      Pulse      Resp      Temp      Temp src      SpO2      Weight      Height      Head Circumference  Peak Flow      Pain Score      Pain Loc      Pain Edu?      Excl. in GC?     Constitutional: Alert and oriented. Well appearing and in no acute distress. Eyes: Conjunctivae are normal. PERRL. EOMI. Head: Atraumatic. Neck: No stridor.   Hematological/Lymphatic/Immunilogical: No cervical lymphadenopathy. Cardiovascular: Normal rate, regular rhythm. Grossly normal heart sounds.  Good peripheral circulation. Respiratory: Normal respiratory effort.  No retractions. Lungs CTAB. Musculoskeletal: On examination of the left wrist there is no gross deformity noted. Range of motion is slow secondary to  discomfort. There is point tenderness on palpation the distal radial and navicular area.  There was no warmth or redness noted in the area.  Neurologic:  Normal speech and language. No gross focal neurologic deficits are appreciated. No gait instability. Skin:  Skin is warm, dry and intact. No rash noted. Psychiatric: Mood and affect are normal. Speech and behavior are normal.  ____________________________________________   LABS (all labs ordered are listed, but only abnormal results are displayed)  Labs Reviewed - No data to display  RADIOLOGY Left wrist x-ray per radiologist: IMPRESSION:  No acute osseous injury of the left wrist.   I, Tommi Rumps, personally viewed and evaluated these images (plain radiographs) as part of my medical decision making, as well as reviewing the written report by the radiologist. ____________________________________________   PROCEDURES  Procedure(s) performed: None  Procedures  Critical Care performed: No  ____________________________________________   INITIAL IMPRESSION / ASSESSMENT AND PLAN / ED COURSE  Pertinent labs & imaging results that were available during my care of the patient were reviewed by me and considered in my medical decision making (see chart for details).  Patient was made aware that x-rays were negative for fracture or severe arthritis. Patient was placed in a thumb spica splint. He is given a prescription for etodolac 400 mg twice a day with food. He is follow-up with his PCP if any continued problems.      ____________________________________________   FINAL CLINICAL IMPRESSION(S) / ED DIAGNOSES  Final diagnoses:  Thumb tendonitis      NEW MEDICATIONS STARTED DURING THIS VISIT:  New Prescriptions   ETODOLAC (LODINE) 400 MG TABLET    Take 1 tablet (400 mg total) by mouth 2 (two) times daily.     Note:  This document was prepared using Dragon voice recognition software and may include unintentional  dictation errors.    Tommi Rumps, PA-C 08/26/16 1401    Emily Filbert, MD 08/26/16 1435

## 2016-09-01 NOTE — ED Notes (Signed)
Chart accessed by this RN to print work note for the patient that he did not get at discharge.

## 2016-09-25 ENCOUNTER — Emergency Department
Admission: EM | Admit: 2016-09-25 | Discharge: 2016-09-25 | Disposition: A | Discharge status: Home / Self Care | Payer: 59 | Attending: Student in an Organized Health Care Education/Training Program | Admitting: Student in an Organized Health Care Education/Training Program

## 2016-09-25 ENCOUNTER — Encounter: Payer: Self-pay | Admitting: Emergency Medicine

## 2016-09-25 DIAGNOSIS — E119 Type 2 diabetes mellitus without complications: Secondary | ICD-10-CM | POA: Insufficient documentation

## 2016-09-25 DIAGNOSIS — F17210 Nicotine dependence, cigarettes, uncomplicated: Secondary | ICD-10-CM | POA: Diagnosis not present

## 2016-09-25 DIAGNOSIS — J449 Chronic obstructive pulmonary disease, unspecified: Secondary | ICD-10-CM | POA: Diagnosis not present

## 2016-09-25 DIAGNOSIS — I10 Essential (primary) hypertension: Secondary | ICD-10-CM | POA: Diagnosis not present

## 2016-09-25 DIAGNOSIS — M654 Radial styloid tenosynovitis [de Quervain]: Secondary | ICD-10-CM | POA: Insufficient documentation

## 2016-09-25 DIAGNOSIS — Z79899 Other long term (current) drug therapy: Secondary | ICD-10-CM | POA: Diagnosis not present

## 2016-09-25 DIAGNOSIS — M25532 Pain in left wrist: Secondary | ICD-10-CM | POA: Diagnosis present

## 2016-09-25 MED ORDER — HYDROCODONE-ACETAMINOPHEN 5-325 MG PO TABS: 1.0000 | ORAL_TABLET | ORAL | 0 refills | Status: DC | PRN

## 2016-09-25 MED ORDER — LIDOCAINE HCL (PF) 1 % IJ SOLN
5.0000 mL | Freq: Once | INTRAMUSCULAR | Status: DC
  Filled 2016-09-25: qty 5

## 2016-09-25 MED ORDER — TRIAMCINOLONE ACETONIDE 40 MG/ML IJ SUSP
20.0000 mg | Freq: Once | INTRAMUSCULAR | Status: DC
  Filled 2016-09-25: qty 2

## 2016-09-25 NOTE — ED Triage Notes (Signed)
Pt presents to ED with c/p LEFT wrist pain radiating to thumb x 1 month. States was seen here and dx with tendinitis, pt states he does a repetitive motion at his work. States pain has not been relieved.

## 2016-09-25 NOTE — ED Provider Notes (Signed)
[facility] Emergency Department Provider Note    First MD Initiated Contact with Patient 09/25/16 2132     (approximate)  I have reviewed the triage vital signs and the nursing notes.   HISTORY  Chief Complaint Hand Pain    HPI Harold Owens is a 46 y.o. male with recent diagnosis of tendinitis of the wrist presents with worsening pain at the base of his left thumb. Patient works doing a Writer. Denies any fevers. States the pain is 10 out of 10 in severity when he touches it. No swelling or warmth. No other trauma other than repetitive motion.  He has tried anti-inflammatories without any improvement.  Right hand dominant.   Past Medical History:  Diagnosis Date  . COPD (chronic obstructive pulmonary disease) (HCC)   . Diabetes mellitus without complication (HCC)   . Hypertension    Family History  Problem Relation Age of Onset  . Cancer Mother   . Diabetes Sister   . Hypertension Sister   . Hypertension Brother    Past Surgical History:  Procedure Laterality Date  . TONSILLECTOMY     There are no active problems to display for this patient.     Prior to Admission medications   Medication Sig Start Date End Date Taking? Authorizing Provider  albuterol (PROVENTIL) (5 MG/ML) 0.5% nebulizer solution Inhale 2.5 mg into the lungs every 4 (four) hours as needed. 11/30/14 11/30/15  [provider]  albuterol (VENTOLIN HFA) 108 (90 Base) MCG/ACT inhaler Inhale 2 puffs into the lungs every 6 (six) hours as needed. 07/11/15 07/10/16  [provider]  econazole nitrate 1 % cream Apply 1 application topically 2 (two) times daily. 07/13/15   [provider]  etodolac (LODINE) 400 MG tablet Take 1 tablet (400 mg total) by mouth 2 (two) times daily. 08/26/16   Tommi Rumps, PA-C  fexofenadine-pseudoephedrine (ALLEGRA-D) 60-120 MG per tablet Take 1 tablet by mouth 2 (two) times daily.  02/07/15   Joni Reining, PA-C  fluticasone (FLONASE) 50 MCG/ACT nasal spray Place 1 spray into both nostrils daily. Patient not taking: Reported on 07/20/2015 02/01/15   Menshew, Charlesetta Ivory, PA-C  HYDROcodone-acetaminophen (NORCO) 5-325 MG tablet Take 1 tablet by mouth every 4 (four) hours as needed for moderate pain. 09/25/16   Willy Eddy, MD  lisinopril-hydrochlorothiazide (PRINZIDE,ZESTORETIC) 10-12.5 MG tablet Take 1 tablet by mouth daily. 07/11/15   [provider]  sildenafil (VIAGRA) 100 MG tablet Take 0.5 mg by mouth daily as needed. 11/30/14   [provider]    Allergies Penicillins    Social History Social History  Substance Use Topics  . Smoking status: Current Every Day Smoker    Packs/day: 1.00    Years: 25.00    Types: Cigarettes  . Smokeless tobacco: Never Used  . Alcohol use No    Review of Systems Patient denies headaches, rhinorrhea, blurry vision, numbness, shortness of breath, chest pain, edema, cough, abdominal pain, nausea, vomiting, diarrhea, dysuria, fevers, rashes or hallucinations unless otherwise stated above in HPI. ____________________________________________   PHYSICAL EXAM:  VITAL SIGNS: Vitals:   09/25/16 2110  BP: 122/69  Pulse: 79  Resp: 18  Temp: 98.3 F (36.8 C)    Constitutional: Alert and oriented. Well appearing and in no acute distress. Eyes: Conjunctivae are normal. PERRL. EOMI. Head: Atraumatic. Nose: No congestion/rhinnorhea. Mouth/Throat: Mucous membranes are moist.  Oropharynx non-erythematous. Neck: No stridor. Painless ROM. No cervical spine  tenderness to palpation Hematological/Lymphatic/Immunilogical: No cervical lymphadenopathy. Cardiovascular: Normal rate, regular rhythm. Grossly normal heart sounds.  Good peripheral circulation. Respiratory: Normal respiratory effort.  No retractions. Lungs CTAB. Gastrointestinal: Soft and nontender. No distention. No abdominal bruits. No CVA  tenderness. Musculoskeletal: No lower extremity tenderness nor edema.  No joint effusions.  No wrist effusion, + finkelsteins test on left hand.   Neurologic:  Normal speech and language. No gross focal neurologic deficits are appreciated. No gait instability. Skin:  Skin is warm, dry and intact. No rash noted. Psychiatric: Mood and affect are normal. Speech and behavior are normal.  ____________________________________________   LABS (all labs ordered are listed, but only abnormal results are displayed)  No results found for this or any previous visit (from the past 24 hour(s)). ____________________________________________  ____________________________________________  RADIOLOGY   ____________________________________________   PROCEDURES  Procedure(s) performed:  Injection tendon or ligament Date/Time: 09/25/2016 10:18 PM Performed by: Willy EddyOBINSON, [place] Blizzard Authorized by: Willy EddyOBINSON, Esma Kilts  Consent: Verbal consent obtained. Consent given by: patient Patient identity confirmed: verbally with patient Local anesthesia used: yes  Anesthesia: Local anesthesia used: yes Local Anesthetic: lidocaine 1% without epinephrine  Sedation: Patient sedated: no Patient tolerance: Patient tolerated the procedure well with no immediate complications Comments: 20mg  of kenalog and 1.645ml of 1% lidocaine injected into and around area of maximum tenderness along tendon sheath       Critical Care performed: no ____________________________________________   INITIAL IMPRESSION / ASSESSMENT AND PLAN / ED COURSE  Pertinent labs & imaging results that were available during my care of the patient were reviewed by me and considered in my medical decision making (see chart for details).  DDX: tendonitis, dequervains, fracture, arthritis  Harold Owens is a 46 y.o. who presents to the ED with what appears to be acute de Quervain's tenosynovitis. No evidence of trauma or clinical features  to suggest fracture. No evidence of septic arthritis. Patient was given a steroid injection with lidocaine to area of maximal tenderness as described above as he was alert he feeling improvement with anti-inflammatory medication. On the ED patient had near total alleviation of his symptoms. Had improvement in his range of motion. Patient does have follow-up with his primary care physician.  Placed in thumb spica splint.   Have discussed with the patient and available family all diagnostics and treatments performed thus far and all questions were answered to the best of my ability. The patient demonstrates understanding and agreement with plan.       ____________________________________________   FINAL CLINICAL IMPRESSION(S) / ED DIAGNOSES  Final diagnoses:  Radial styloid tenosynovitis (de quervain)  Acute wrist pain, left      NEW MEDICATIONS STARTED DURING THIS VISIT:  New Prescriptions   HYDROCODONE-ACETAMINOPHEN (NORCO) 5-325 MG TABLET    Take 1 tablet by mouth every 4 (four) hours as needed for moderate pain.     Note:  This document was prepared using Dragon voice recognition software and may include unintentional dictation errors.    Willy Eddyobinson, Zriyah Kopplin, MD 09/25/16 2221

## 2016-09-25 NOTE — ED Notes (Addendum)
Pt states seen 1 month ago and dx with tendonitis and instructed to follow up with PCP. Pt states followed with PCP on a Sunday and returned to work Monday. Pt states no better.

## 2016-09-25 NOTE — ED Notes (Signed)

## 2017-01-06 ENCOUNTER — Emergency Department
Admission: EM | Admit: 2017-01-06 | Discharge: 2017-01-06 | Disposition: A | Discharge status: Home / Self Care | Payer: 59 | Attending: Emergency Medicine | Admitting: Emergency Medicine

## 2017-01-06 ENCOUNTER — Encounter: Payer: Self-pay | Admitting: Emergency Medicine

## 2017-01-06 DIAGNOSIS — F17210 Nicotine dependence, cigarettes, uncomplicated: Secondary | ICD-10-CM | POA: Diagnosis not present

## 2017-01-06 DIAGNOSIS — E119 Type 2 diabetes mellitus without complications: Secondary | ICD-10-CM | POA: Insufficient documentation

## 2017-01-06 DIAGNOSIS — Z79899 Other long term (current) drug therapy: Secondary | ICD-10-CM | POA: Insufficient documentation

## 2017-01-06 DIAGNOSIS — J069 Acute upper respiratory infection, unspecified: Secondary | ICD-10-CM | POA: Insufficient documentation

## 2017-01-06 DIAGNOSIS — R0981 Nasal congestion: Secondary | ICD-10-CM | POA: Diagnosis present

## 2017-01-06 DIAGNOSIS — J449 Chronic obstructive pulmonary disease, unspecified: Secondary | ICD-10-CM | POA: Insufficient documentation

## 2017-01-06 DIAGNOSIS — I10 Essential (primary) hypertension: Secondary | ICD-10-CM | POA: Insufficient documentation

## 2017-01-06 MED ORDER — BENZONATATE 100 MG PO CAPS: 200.0000 mg | ORAL_CAPSULE | Freq: Three times a day (TID) | ORAL | 0 refills | Status: AC | PRN

## 2017-01-06 NOTE — ED Notes (Signed)
See triage note  States he developed some nasal congestion and cough on Saturday   Throat feels scratchy  States cough is non prod    Afebrile on arrival

## 2017-01-06 NOTE — ED Provider Notes (Signed)
[facility] Emergency Department Provider Note   ____________________________________________   First MD Initiated Contact with Patient 01/06/17 [phone]     (approximate)  I have reviewed the triage vital signs and the nursing notes.   HISTORY  Chief Complaint Nasal Congestion   HPI Harold Owens is a 46 y.o. male is here complaining of nasal congestion and drainage down his throat. Patient states that he has not had any fever or chills. He has been taking over-the-counter medication for allergies. He is a smoker. He states the nasal congestion started a couple days ago.   Past Medical History:  Diagnosis Date  . COPD (chronic obstructive pulmonary disease) (HCC)   . Diabetes mellitus without complication (HCC)   . Hypertension     There are no active problems to display for this patient.   Past Surgical History:  Procedure Laterality Date  . TONSILLECTOMY      Prior to Admission medications   Medication Sig Start Date End Date Taking? Authorizing Provider  albuterol (PROVENTIL) (5 MG/ML) 0.5% nebulizer solution Inhale 2.5 mg into the lungs every 4 (four) hours as needed. 11/30/14 11/30/15  [provider]  albuterol (VENTOLIN HFA) 108 (90 Base) MCG/ACT inhaler Inhale 2 puffs into the lungs every 6 (six) hours as needed. 07/11/15 07/10/16  [provider]  benzonatate (TESSALON PERLES) 100 MG capsule Take 2 capsules (200 mg total) by mouth 3 (three) times daily as needed. 01/06/17 01/06/18  Tommi Rumps, PA-C  econazole nitrate 1 % cream Apply 1 application topically 2 (two) times daily. 07/13/15   [provider]  lisinopril-hydrochlorothiazide (PRINZIDE,ZESTORETIC) 10-12.5 MG tablet Take 1 tablet by mouth daily. 07/11/15   [provider]  sildenafil (VIAGRA) 100 MG tablet Take 0.5 mg by mouth daily as needed. 11/30/14   [provider]    Allergies Penicillins  Family History  Problem Relation  Age of Onset  . Cancer Mother   . Diabetes Sister   . Hypertension Sister   . Hypertension Brother     Social History Social History  Substance Use Topics  . Smoking status: Current Every Day Smoker    Packs/day: 1.00    Years: 25.00    Types: Cigarettes  . Smokeless tobacco: Never Used  . Alcohol use No    Review of Systems Constitutional: No fever/chills ENT: No sore throat. Positive nasal congestion Cardiovascular: Denies chest pain. Respiratory: Denies shortness of breath. Nonproductive cough. Gastrointestinal: No abdominal pain.  No nausea, no vomiting.  Musculoskeletal: Negative for back pain. Neurological: Negative for headaches, focal weakness or numbness.   ____________________________________________   PHYSICAL EXAM:  VITAL SIGNS: ED Triage Vitals  Enc Vitals Group     BP 01/06/17 0853 126/71     Pulse Rate 01/06/17 0853 87     Resp 01/06/17 0853 18     Temp 01/06/17 0853 98.3 F (36.8 C)     Temp Source 01/06/17 0853 Oral     SpO2 01/06/17 0853 97 %     Weight 01/06/17 0850 (!) 306 lb (138.8 kg)     Height 01/06/17 0850 5\' 4"  (1.626 m)     Head Circumference --      Peak Flow --      Pain Score 01/06/17 0849 6     Pain Loc --      Pain Edu? --      Excl. in GC? --    Constitutional: Alert and oriented. Well appearing and in no  acute distress. Eyes: Conjunctivae are normal.  Head: Atraumatic. Nose: Positive congestion/negative rhinnorhea. Negative sinus tenderness to percussion. Mouth/Throat: Mucous membranes are moist.  Oropharynx non-erythematous. Minimal post to drainage. Neck: No stridor.   Hematological/Lymphatic/Immunilogical: No cervical lymphadenopathy. Cardiovascular: Normal rate, regular rhythm. Grossly normal heart sounds.  Good peripheral circulation. Respiratory: Normal respiratory effort.  No retractions. Lungs CTAB. Musculoskeletal: Moves upper and lower extremities without any difficulty and normal gait was noted. Neurologic:   Normal speech and language. No gross focal neurologic deficits are appreciated. No gait instability. Skin:  Skin is warm, dry and intact. No rash noted. Psychiatric: Mood and affect are normal. Speech and behavior are normal.  ____________________________________________   LABS (all labs ordered are listed, but only abnormal results are displayed)  Labs Reviewed - No data to display  PROCEDURES  Procedure(s) performed: None  Procedures  Critical Care performed: No  ____________________________________________   INITIAL IMPRESSION / ASSESSMENT AND PLAN / ED COURSE  Pertinent labs & imaging results that were available during my care of the patient were reviewed by me and considered in my medical decision making (see chart for details).  Patient continue taking his over-the-counter allergy medication and follow-up with his primary care doctor if any continued problems. He is encouraged decrease smoking. He was given a prescription for [facility] as needed for cough. We also discussed using saline nose spray as needed for nasal congestion.   ____________________________________________   FINAL CLINICAL IMPRESSION(S) / ED DIAGNOSES  Final diagnoses:  Upper respiratory tract infection, unspecified type  Cigarette smoker      NEW MEDICATIONS STARTED DURING THIS VISIT:  Discharge Medication List as of 01/06/2017  9:43 AM    START taking these medications   Details  benzonatate (TESSALON PERLES) 100 MG capsule Take 2 capsules (200 mg total) by mouth 3 (three) times daily as needed., Starting Mon 01/06/2017, Until Tue 01/06/2018, Print         Note:  This document was prepared using Dragon voice recognition software and may include unintentional dictation errors.    Tommi Rumps, PA-C 01/06/17 1612    Merrily Brittle, MD 01/07/17 [phone]

## 2017-01-06 NOTE — Discharge Instructions (Signed)
Continue taking her current medication. Follow-up with your primary care doctor if any continued problems. Decrease smoking. Increase fluids. Begin taking Tessalon Perles as needed for cough. Obtained a saline nose spray and use often as needed for nasal congestion.

## 2017-01-06 NOTE — ED Triage Notes (Signed)
Pt c/o nasal congestion and feels like draining in throat and cant cough up.  Ambulatory to check in without difficulty. Unlabored respirations.  NAD.  No fevers.

## 2017-02-23 ENCOUNTER — Emergency Department
Admission: EM | Admit: 2017-02-23 | Discharge: 2017-02-23 | Disposition: A | Discharge status: Home / Self Care | Payer: 59 | Attending: Emergency Medicine | Admitting: Emergency Medicine

## 2017-02-23 DIAGNOSIS — Z79899 Other long term (current) drug therapy: Secondary | ICD-10-CM | POA: Diagnosis not present

## 2017-02-23 DIAGNOSIS — M654 Radial styloid tenosynovitis [de Quervain]: Secondary | ICD-10-CM | POA: Diagnosis not present

## 2017-02-23 DIAGNOSIS — M23306 Other meniscus derangements, unspecified meniscus, right knee: Secondary | ICD-10-CM | POA: Insufficient documentation

## 2017-02-23 DIAGNOSIS — I10 Essential (primary) hypertension: Secondary | ICD-10-CM | POA: Diagnosis not present

## 2017-02-23 DIAGNOSIS — F17210 Nicotine dependence, cigarettes, uncomplicated: Secondary | ICD-10-CM | POA: Insufficient documentation

## 2017-02-23 DIAGNOSIS — J449 Chronic obstructive pulmonary disease, unspecified: Secondary | ICD-10-CM | POA: Diagnosis not present

## 2017-02-23 DIAGNOSIS — E119 Type 2 diabetes mellitus without complications: Secondary | ICD-10-CM | POA: Insufficient documentation

## 2017-02-23 DIAGNOSIS — M79602 Pain in left arm: Secondary | ICD-10-CM | POA: Diagnosis present

## 2017-02-23 MED ORDER — IBUPROFEN 800 MG PO TABS: 800.0000 mg | ORAL_TABLET | Freq: Three times a day (TID) | ORAL | 0 refills | Status: DC | PRN

## 2017-02-23 MED ORDER — TRAMADOL HCL 50 MG PO TABS: 50.0000 mg | ORAL_TABLET | Freq: Four times a day (QID) | ORAL | 0 refills | Status: DC | PRN

## 2017-02-23 MED ORDER — PREDNISONE 50 MG PO TABS: ORAL_TABLET | ORAL | 0 refills | Status: DC

## 2017-02-23 MED ORDER — HYDROCODONE-ACETAMINOPHEN 5-325 MG PO TABS: 1.0000 | ORAL_TABLET | Freq: Four times a day (QID) | ORAL | 0 refills | Status: DC | PRN

## 2017-02-23 NOTE — ED Triage Notes (Signed)
Pt c/p left arm pain since Wednesday. Reports it is a flare up on his tendonitis. Also c/o right knee pain, vs stable, pt ambulatory in triage.

## 2017-02-23 NOTE — ED Notes (Signed)
See triage note states he is here d/t pain to left thumb area and right knee   States he thinks it is tendonitis in thumb and he has a torn meniscus in right knee   States he has braces for both but still having pain  Ambulates well to treatment area

## 2017-02-23 NOTE — ED Provider Notes (Signed)
[facility] Emergency Department Provider Note  ____________________________________________  Time seen: Approximately 9:41 AM  I have reviewed the triage vital signs and the nursing notes.   HISTORY  Chief Complaint Arm Pain and Knee Pain    HPI Harold Owens is a 46 y.o. male that presents to the emergency department for evaluation of tendinitis in left wrist and torn meniscus in right knee. Patient states that he gets an occasional flareup tendinitis in his wrist. Injections have helped in the past. He wore his brace last night but it is still hurting. He has been evaluated by orthopedics for his knee. He was told that the meniscus is gone and it is inoperable. He is looking for a new knee brace. He took 800 mg of ibuprofen on Thursday night, which did not help. No injuries.No fever, shortness of breath, chest pain, numbness, tingling.   Past Medical History:  Diagnosis Date  . COPD (chronic obstructive pulmonary disease) (HCC)   . Diabetes mellitus without complication (HCC)   . Hypertension     There are no active problems to display for this patient.   Past Surgical History:  Procedure Laterality Date  . TONSILLECTOMY      Prior to Admission medications   Medication Sig Start Date End Date Taking? Authorizing Provider  albuterol (PROVENTIL) (5 MG/ML) 0.5% nebulizer solution Inhale 2.5 mg into the lungs every 4 (four) hours as needed. 11/30/14 11/30/15  [provider]  albuterol (VENTOLIN HFA) 108 (90 Base) MCG/ACT inhaler Inhale 2 puffs into the lungs every 6 (six) hours as needed. 07/11/15 07/10/16  [provider]  benzonatate (TESSALON PERLES) 100 MG capsule Take 2 capsules (200 mg total) by mouth 3 (three) times daily as needed. 01/06/17 01/06/18  Tommi Rumps, PA-C  econazole nitrate 1 % cream Apply 1 application topically 2 (two) times daily. 07/13/15   [provider]  HYDROcodone-acetaminophen  (NORCO/VICODIN) 5-325 MG tablet Take 1 tablet by mouth every 6 (six) hours as needed for moderate pain. 02/23/17   Enid Derry, PA-C  ibuprofen (ADVIL,MOTRIN) 800 MG tablet Take 1 tablet (800 mg total) by mouth every 8 (eight) hours as needed. 02/23/17   Enid Derry, PA-C  lisinopril-hydrochlorothiazide (PRINZIDE,ZESTORETIC) 10-12.5 MG tablet Take 1 tablet by mouth daily. 07/11/15   [provider]  predniSONE (DELTASONE) 50 MG tablet Take 1 tablet per day 02/23/17   Enid Derry, PA-C  sildenafil (VIAGRA) 100 MG tablet Take 0.5 mg by mouth daily as needed. 11/30/14   [provider]    Allergies Penicillins  Family History  Problem Relation Age of Onset  . Cancer Mother   . Diabetes Sister   . Hypertension Sister   . Hypertension Brother     Social History Social History  Substance Use Topics  . Smoking status: Current Every Day Smoker    Packs/day: 1.00    Years: 25.00    Types: Cigarettes  . Smokeless tobacco: Never Used  . Alcohol use No     Review of Systems  Constitutional: No fever/chills Cardiovascular: No chest pain. Respiratory:  No SOB. Gastrointestinal: No abdominal pain.  No nausea, no vomiting.  Musculoskeletal: Positive for wrist and knee pain. Skin: Negative for rash, abrasions, lacerations, ecchymosis. Neurological: Negative for headaches, numbness or tingling   ____________________________________________   PHYSICAL EXAM:  VITAL SIGNS: ED Triage Vitals [02/23/17 0817]  Enc Vitals Group     BP 128/70     Pulse Rate 85     Resp  16     Temp 98.2 F (36.8 C)     Temp Source Oral     SpO2 95 %     Weight (!) 305 lb (138.3 kg)     Height  (1.676 m)     Head Circumference      Peak Flow      Pain Score      Pain Loc      Pain Edu?      Excl. in GC?      Constitutional: Alert and oriented. Well appearing and in no acute distress. Eyes: Conjunctivae are normal. PERRL. EOMI. Head: Atraumatic. ENT:      Ears:       Nose: No congestion/rhinnorhea.      Mouth/Throat: Mucous membranes are moist.  Neck: No stridor.  Cardiovascular: Normal rate, regular rhythm.  Good peripheral circulation. Palpable radial and dorsalis pedis pulses. Respiratory: Normal respiratory effort without tachypnea or retractions. Lungs CTAB. Good air entry to the bases with no decreased or absent breath sounds. Musculoskeletal: Full range of motion to all extremities. No gross deformities appreciated. Positive Finkelstein's test. Tenderness to palpation over radial side of right wrist. No tenderness to palpation over knee. Full range of motion of knee. Strength 5 out of 5 in lower and upper extremities bilaterally. Neurologic:  Normal speech and language. No gross focal neurologic deficits are appreciated.  Skin:  Skin is warm, dry and intact. No rash noted.    ____________________________________________   LABS (all labs ordered are listed, but only abnormal results are displayed)  Labs Reviewed - No data to display ____________________________________________  EKG   ____________________________________________  RADIOLOGY  No results found.  ____________________________________________    PROCEDURES  Procedure(s) performed:    Procedures    Medications - No data to display   ____________________________________________   INITIAL IMPRESSION / ASSESSMENT AND PLAN / ED COURSE  Pertinent labs & imaging results that were available during my care of the patient were reviewed by me and considered in my medical decision making (see chart for details).  Review of the [place] CSRS was performed in accordance of the [facility] prior to dispensing any controlled drugs.   Patient presented to the emergency department for evaluation of left wrist pain and right knee pain. Patient has been told that the meniscus and his knee is gone and it is inoperable. Pain in wrist is consistent with tendinitis. New wrist splint was given. No  injuries. He does not want any IM medications. Patient will be discharged home with prescriptions for prednisone, ibuprofen, short course of Vicodin. Patient is to follow up with PCP as directed. Patient is given ED precautions to return to the ED for any worsening or new symptoms.     ____________________________________________  FINAL CLINICAL IMPRESSION(S) / ED DIAGNOSES  Final diagnoses:  Tendinitis, de Quervain's  Meniscus degeneration, right      NEW MEDICATIONS STARTED DURING THIS VISIT:  Discharge Medication List as of 02/23/2017 10:03 AM    START taking these medications   Details  HYDROcodone-acetaminophen (NORCO/VICODIN) 5-325 MG tablet Take 1 tablet by mouth every 6 (six) hours as needed for moderate pain., Starting Sun 02/23/2017, Print    ibuprofen (ADVIL,MOTRIN) 800 MG tablet Take 1 tablet (800 mg total) by mouth every 8 (eight) hours as needed., Starting Sun 02/23/2017, Print    predniSONE (DELTASONE) 50 MG tablet Take 1 tablet per day, Print            This chart was dictated  using voice recognition software/Dragon. Despite best efforts to proofread, errors can occur which can change the meaning. Any change was purely unintentional.    Enid Derry, PA-C 02/23/17 1050    Emily Filbert, MD 02/23/17 1434

## 2017-03-23 ENCOUNTER — Emergency Department
Admission: EM | Admit: 2017-03-23 | Discharge: 2017-03-23 | Disposition: A | Discharge status: Home / Self Care | Payer: 59 | Attending: Emergency Medicine | Admitting: Emergency Medicine

## 2017-03-23 ENCOUNTER — Other Ambulatory Visit: Payer: Self-pay

## 2017-03-23 ENCOUNTER — Encounter: Payer: Self-pay | Admitting: Emergency Medicine

## 2017-03-23 DIAGNOSIS — M654 Radial styloid tenosynovitis [de Quervain]: Secondary | ICD-10-CM | POA: Insufficient documentation

## 2017-03-23 DIAGNOSIS — Z880 Allergy status to penicillin: Secondary | ICD-10-CM | POA: Insufficient documentation

## 2017-03-23 DIAGNOSIS — J449 Chronic obstructive pulmonary disease, unspecified: Secondary | ICD-10-CM | POA: Insufficient documentation

## 2017-03-23 DIAGNOSIS — E119 Type 2 diabetes mellitus without complications: Secondary | ICD-10-CM | POA: Diagnosis not present

## 2017-03-23 DIAGNOSIS — Z79899 Other long term (current) drug therapy: Secondary | ICD-10-CM | POA: Diagnosis not present

## 2017-03-23 DIAGNOSIS — F17210 Nicotine dependence, cigarettes, uncomplicated: Secondary | ICD-10-CM | POA: Diagnosis not present

## 2017-03-23 DIAGNOSIS — M25532 Pain in left wrist: Secondary | ICD-10-CM | POA: Diagnosis present

## 2017-03-23 DIAGNOSIS — I10 Essential (primary) hypertension: Secondary | ICD-10-CM | POA: Insufficient documentation

## 2017-03-23 MED ORDER — MELOXICAM 7.5 MG PO TABS: 7.5000 mg | ORAL_TABLET | Freq: Every day | ORAL | 1 refills | Status: AC

## 2017-03-23 MED ORDER — PREDNISONE 50 MG PO TABS: ORAL_TABLET | ORAL | 0 refills | Status: DC

## 2017-03-23 NOTE — ED Provider Notes (Signed)
[facility] Emergency Department Provider Note  ____________________________________________  Time seen: Approximately 3:36 PM  I have reviewed the triage vital signs and the nursing notes.   HISTORY  Chief Complaint Hand Pain    HPI Harold Owens is a 46 y.o. male with a history of de Quervain's tenosynovitis presents to the emergency department with worsening left wrist pain.  Patient rates his pain at 10 out of 10 in intensity and relays that pain radiates up his left forearm.  Patient's pain is worsened with ulnar deviation at the wrist.  Patient reports that he has been under the care of Dr. Deeann Saint who has performed injections for de Quervain's.  Patient has been wearing a wrist splint.  Patient reports that he has repetitive tasks that he undergoes at work.  He has been taking anti-inflammatories.   Past Medical History:  Diagnosis Date  . COPD (chronic obstructive pulmonary disease) (HCC)   . Diabetes mellitus without complication (HCC)   . Hypertension     There are no active problems to display for this patient.   Past Surgical History:  Procedure Laterality Date  . TONSILLECTOMY      Prior to Admission medications   Medication Sig Start Date End Date Taking? Authorizing Provider  albuterol (PROVENTIL) (5 MG/ML) 0.5% nebulizer solution Inhale 2.5 mg into the lungs every 4 (four) hours as needed. 11/30/14 11/30/15  [provider]  albuterol (VENTOLIN HFA) 108 (90 Base) MCG/ACT inhaler Inhale 2 puffs into the lungs every 6 (six) hours as needed. 07/11/15 07/10/16  [provider]  benzonatate (TESSALON PERLES) 100 MG capsule Take 2 capsules (200 mg total) by mouth 3 (three) times daily as needed. 01/06/17 01/06/18  Tommi Rumps, PA-C  econazole nitrate 1 % cream Apply 1 application topically 2 (two) times daily. 07/13/15   [provider]  HYDROcodone-acetaminophen (NORCO/VICODIN) 5-325 MG tablet Take 1  tablet by mouth every 6 (six) hours as needed for moderate pain. 02/23/17   Enid Derry, PA-C  ibuprofen (ADVIL,MOTRIN) 800 MG tablet Take 1 tablet (800 mg total) by mouth every 8 (eight) hours as needed. 02/23/17   Enid Derry, PA-C  lisinopril-hydrochlorothiazide (PRINZIDE,ZESTORETIC) 10-12.5 MG tablet Take 1 tablet by mouth daily. 07/11/15   [provider]  meloxicam (MOBIC) 7.5 MG tablet Take 1 tablet (7.5 mg total) daily for 7 days by mouth. 03/23/17 03/30/17  Orvil Feil, PA-C  predniSONE (DELTASONE) 50 MG tablet Take one 50mg  tablet once a day for five days. 03/23/17   Orvil Feil, PA-C  sildenafil (VIAGRA) 100 MG tablet Take 0.5 mg by mouth daily as needed. 11/30/14   [provider]    Allergies Penicillins  Family History  Problem Relation Age of Onset  . Cancer Mother   . Diabetes Sister   . Hypertension Sister   . Hypertension Brother     Social History Social History   Tobacco Use  . Smoking status: Current Every Day Smoker    Packs/day: 1.00    Years: 25.00    Pack years: 25.00    Types: Cigarettes  . Smokeless tobacco: Never Used  Substance Use Topics  . Alcohol use: No    Alcohol/week: 0.0 oz  . Drug use: No     Review of Systems  Constitutional: No fever/chills Eyes: No visual changes. No discharge ENT: No upper respiratory complaints. Cardiovascular: no chest pain. Respiratory: no cough. No SOB. Musculoskeletal: Patient has left wrist pain Skin: Negative for rash,  abrasions, lacerations, ecchymosis. Neurological: Negative for headaches, focal weakness or numbness.  ____________________________________________   PHYSICAL EXAM:  VITAL SIGNS: ED Triage Vitals  Enc Vitals Group     BP 03/23/17 1422 (!) 143/82     Pulse Rate 03/23/17 1422 86     Resp 03/23/17 1422 18     Temp 03/23/17 1422 98.4 F (36.9 C)     Temp src --      SpO2 03/23/17 1422 98 %     Weight 03/23/17 1423 (!) 313 lb (142 kg)     Height 03/23/17  1423 5\' 5"  (1.651 m)     Head Circumference --      Peak Flow --      Pain Score 03/23/17 1429 6     Pain Loc --      Pain Edu? --      Excl. in GC? --      Constitutional: Alert and oriented. Well appearing and in no acute distress. Eyes: Conjunctivae are normal. PERRL. EOMI. Head: Atraumatic. Cardiovascular: Normal rate, regular rhythm. Normal S1 and S2.  Good peripheral circulation. Respiratory: Normal respiratory effort without tachypnea or retractions. Lungs CTAB. Good air entry to the bases with no decreased or absent breath sounds. Musculoskeletal: Patient's left forearm pain is reproduced with Lourena SimmondsFinkelstein test.  Palpable radial pulse, left.  Patient can perform full range of motion at the left wrist and left elbow. Neurologic:  Normal speech and language. No gross focal neurologic deficits are appreciated.  Skin:  Skin is warm, dry and intact. No rash noted. Psychiatric: Mood and affect are normal. Speech and behavior are normal. Patient exhibits appropriate insight and judgement.   ____________________________________________   LABS (all labs ordered are listed, but only abnormal results are displayed)  Labs Reviewed - No data to display ____________________________________________  EKG   ____________________________________________  RADIOLOGY   No results found.  ____________________________________________    PROCEDURES  Procedure(s) performed:    Procedures    Medications - No data to display   ____________________________________________   INITIAL IMPRESSION / ASSESSMENT AND PLAN / ED COURSE  Pertinent labs & imaging results that were available during my care of the patient were reviewed by me and considered in my medical decision making (see chart for details).  Review of the [place] CSRS was performed in accordance of the [facility] prior to dispensing any controlled drugs.     Assessment and plan De Quervain's tenosynovitis Patient presents to  the emergency department with history and physical exam findings that are consistent with chronic de Quervain's tenosynovitis worsened with repetitive activities at work.  Patient was discharged with prednisone and Mobic, to be started after the completion of prednisone.  Patient was advised to follow-up with Dr. Hyacinth MeekerMiller to discuss additional management options.  Vital signs are reassuring prior to discharge.  All patient questions were answered.   ____________________________________________  FINAL CLINICAL IMPRESSION(S) / ED DIAGNOSES  Final diagnoses:  De Quervain's tenosynovitis      NEW MEDICATIONS STARTED DURING THIS VISIT:  This SmartLink is deprecated. Use AVSMEDLIST instead to display the medication list for a patient.      This chart was dictated using voice recognition software/Dragon. Despite best efforts to proofread, errors can occur which can change the meaning. Any change was purely unintentional.    Orvil FeilWoods, Orena Cavazos M, PA-C 03/23/17 1541    Jeanmarie PlantMcShane, James A, MD 03/23/17 [id number]

## 2017-03-23 NOTE — ED Triage Notes (Signed)
C/O flare up of left hand tendonitis.  Onset of symptoms Friday night, while at work.

## 2017-05-14 ENCOUNTER — Telehealth: Payer: Self-pay | Admitting: Pharmacy Technician

## 2017-05-14 NOTE — Telephone Encounter (Signed)
Patient failed to provide current poi.  No additional medication assistance will be provided by [facility] without the required proof of income documentation.  Patient notified by letter.  Walter Grima J. Apryll Hinkle Care Manager [facility] 

## 2017-09-17 ENCOUNTER — Other Ambulatory Visit: Payer: Self-pay | Admitting: Orthopaedic Surgery

## 2017-09-17 DIAGNOSIS — M65832 Other synovitis and tenosynovitis, left forearm: Secondary | ICD-10-CM

## 2017-09-22 ENCOUNTER — Ambulatory Visit
Admission: RE | Admit: 2017-09-22 | Discharge: 2017-09-22 | Disposition: A | Discharge status: Home / Self Care | Payer: 59 | Source: Ambulatory Visit | Attending: Orthopaedic Surgery | Admitting: Orthopaedic Surgery

## 2017-09-22 DIAGNOSIS — M65932 Unspecified synovitis and tenosynovitis, left forearm: Secondary | ICD-10-CM

## 2017-09-22 DIAGNOSIS — S66812A Strain of other specified muscles, fascia and tendons at wrist and hand level, left hand, initial encounter: Secondary | ICD-10-CM | POA: Diagnosis not present

## 2017-09-22 DIAGNOSIS — M65842 Other synovitis and tenosynovitis, left hand: Secondary | ICD-10-CM | POA: Diagnosis not present

## 2017-09-22 DIAGNOSIS — M65832 Other synovitis and tenosynovitis, left forearm: Secondary | ICD-10-CM

## 2018-01-01 ENCOUNTER — Other Ambulatory Visit: Payer: Self-pay

## 2018-01-01 ENCOUNTER — Emergency Department
Admission: EM | Admit: 2018-01-01 | Discharge: 2018-01-01 | Disposition: A | Discharge status: Home / Self Care | Payer: 59 | Attending: Emergency Medicine | Admitting: Emergency Medicine

## 2018-01-01 ENCOUNTER — Encounter: Payer: Self-pay | Admitting: Emergency Medicine

## 2018-01-01 DIAGNOSIS — G8929 Other chronic pain: Secondary | ICD-10-CM | POA: Insufficient documentation

## 2018-01-01 DIAGNOSIS — I10 Essential (primary) hypertension: Secondary | ICD-10-CM | POA: Diagnosis not present

## 2018-01-01 DIAGNOSIS — E119 Type 2 diabetes mellitus without complications: Secondary | ICD-10-CM | POA: Diagnosis not present

## 2018-01-01 DIAGNOSIS — Z79899 Other long term (current) drug therapy: Secondary | ICD-10-CM | POA: Diagnosis not present

## 2018-01-01 DIAGNOSIS — J449 Chronic obstructive pulmonary disease, unspecified: Secondary | ICD-10-CM | POA: Diagnosis not present

## 2018-01-01 DIAGNOSIS — F17210 Nicotine dependence, cigarettes, uncomplicated: Secondary | ICD-10-CM | POA: Insufficient documentation

## 2018-01-01 DIAGNOSIS — M25532 Pain in left wrist: Secondary | ICD-10-CM | POA: Diagnosis present

## 2018-01-01 NOTE — ED Triage Notes (Signed)
Pt presents to ED with left wrist pain. 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 Pt states his pain and inflammation has increased since he was released to go back to work 3 weeks ago. Pt states he is wanting to go see his orthopedist tomorrow.

## 2018-01-01 NOTE — Discharge Instructions (Addendum)
Follow-up either with Dr. Mathis BudSoria-Hernandez, the chronic pain doctor she referred you to, or the [facility] [place].

## 2018-01-01 NOTE — ED Provider Notes (Signed)
[facility] Emergency Department Provider Note  ____________________________________________   First MD Initiated Contact with Patient 01/01/18 2030     (approximate)  I have reviewed the triage vital signs and the nursing notes.   HISTORY  Chief Complaint Wrist Pain    HPI Harold Owens is a 47 y.o. male since emergency department complaining of chronic left wrist and hand pain.  He states he had been seen by Dr. Mathis BudSoria-Hernandez who did surgery and has now released him.  He states he bent back to work 3 weeks ago and now his hand hurts again.  Dr. Stephenie AcresSoria had referred him to chronic pain as she told him she did not know what else she could do.    Past Medical History:  Diagnosis Date  . COPD (chronic obstructive pulmonary disease) (HCC)   . Diabetes mellitus without complication (HCC)   . Hypertension     There are no active problems to display for this patient.   Past Surgical History:  Procedure Laterality Date  . TONSILLECTOMY    . WRIST SURGERY      Prior to Admission medications   Medication Sig Start Date End Date Taking? Authorizing Provider  albuterol (PROVENTIL) (5 MG/ML) 0.5% nebulizer solution Inhale 2.5 mg into the lungs every 4 (four) hours as needed. 11/30/14 11/30/15  [provider]  albuterol (VENTOLIN HFA) 108 (90 Base) MCG/ACT inhaler Inhale 2 puffs into the lungs every 6 (six) hours as needed. 07/11/15 07/10/16  [provider]  benzonatate (TESSALON PERLES) 100 MG capsule Take 2 capsules (200 mg total) by mouth 3 (three) times daily as needed. 01/06/17 01/06/18  Tommi RumpsSummers, Rhonda L, PA-C  econazole nitrate 1 % cream Apply 1 application topically 2 (two) times daily. 07/13/15   [provider]  HYDROcodone-acetaminophen (NORCO/VICODIN) 5-325 MG tablet Take 1 tablet by mouth every 6 (six) hours as needed for moderate pain. 02/23/17   Enid DerryWagner, Ashley, PA-C  ibuprofen (ADVIL,MOTRIN) 800 MG tablet Take 1  tablet (800 mg total) by mouth every 8 (eight) hours as needed. 02/23/17   Enid DerryWagner, Ashley, PA-C  lisinopril-hydrochlorothiazide (PRINZIDE,ZESTORETIC) 10-12.5 MG tablet Take 1 tablet by mouth daily. 07/11/15   [provider]  predniSONE (DELTASONE) 50 MG tablet Take one 50mg  tablet once a day for five days. 03/23/17   Orvil FeilWoods, Jaclyn M, PA-C  sildenafil (VIAGRA) 100 MG tablet Take 0.5 mg by mouth daily as needed. 11/30/14   [provider]    Allergies Penicillins  Family History  Problem Relation Age of Onset  . Cancer Mother   . Diabetes Sister   . Hypertension Sister   . Hypertension Brother     Social History Social History   Tobacco Use  . Smoking status: Current Every Day Smoker    Packs/day: 1.00    Years: 25.00    Pack years: 25.00    Types: Cigarettes  . Smokeless tobacco: Never Used  Substance Use Topics  . Alcohol use: No    Alcohol/week: 0.0 standard drinks  . Drug use: No    Review of Systems  Constitutional: No fever/chills Eyes: No visual changes. ENT: No sore throat. Respiratory: Denies cough Genitourinary: Negative for dysuria. Musculoskeletal: Negative for back pain.  Chronic right hand and wrist pain Skin: Negative for rash.    ____________________________________________   PHYSICAL EXAM:  VITAL SIGNS: ED Triage Vitals  Enc Vitals Group     BP 01/01/18 1947 120/70     Pulse Rate 01/01/18 1947 91  Resp 01/01/18 1947 18     Temp 01/01/18 1947 98.7 F (37.1 C)     Temp Source 01/01/18 1947 Oral     SpO2 01/01/18 1947 98 %     Weight 01/01/18 1947 (!) 315 lb (142.9 kg)     Height 01/01/18 1947 5\' 6"  (1.676 m)     Head Circumference --      Peak Flow --      Pain Score 01/01/18 1953 8     Pain Loc --      Pain Edu? --      Excl. in GC? --     Constitutional: Alert and oriented. Well appearing and in no acute distress. Eyes: Conjunctivae are normal.  Head: Atraumatic. Nose: No congestion/rhinnorhea. Mouth/Throat:  Mucous membranes are moist.   Neck:  supple no lymphadenopathy noted Cardiovascular: Normal rate, regular rhythm.  Respiratory: Normal respiratory effort.  No retractions,   GU: deferred Musculoskeletal: FROM all extremities, warm and well perfused.  Positive for 2 kn on the dorsum of the left hand which appear to be hard and cystlike.  Scar is noted on the radial aspect of the left wrist.  Patient has full range of motion, no redness or swelling noted. Neurologic:  Normal speech and language.  Skin:  Skin is warm, dry and intact. No rash noted. Psychiatric: Mood and affect are normal. Speech and behavior are normal.  ____________________________________________   LABS (all labs ordered are listed, but only abnormal results are displayed)  Labs Reviewed - No data to display ____________________________________________   ____________________________________________  RADIOLOGY    ____________________________________________   PROCEDURES  Procedure(s) performed: No  Procedures    ____________________________________________   INITIAL IMPRESSION / ASSESSMENT AND PLAN / ED COURSE  Pertinent labs & imaging results that were available during my care of the patient were reviewed by me and considered in my medical decision making (see chart for details).   Patient is a 47 year old male presents emergency department complaining of chronic left hand and wrist pain.  He was released by Dr. Mathis BudSoria-Hernandez to go back to work 3 weeks ago and his pain has increased since he went back to work.  She had referred him to the chronic pain doctor but he is not seeing them or made an appointment with him.  On physical exam the left hand is tender along the dorsum.  There are 2 hard knots noted which are typical for cyst.  He has full range of motion and is neurovascularly intact.  Explained the findings to the patient.  Explained to him that he is already seeing a specialist at this point we  would need for him to either see his doctor or see a another hand specialist for evaluation.  Or as Dr. Mathis BudSoria-Hernandez had instructed him to see chronic pain he should call and make this appointment.  He was given a note stating he should have light duty until evaluated by a physician.  He states he understands just did not know what to do.  He was discharged in stable condition.     As part of my medical decision making, I reviewed the following data within the electronic MEDICAL RECORD NUMBER Nursing notes reviewed and incorporated, Old chart reviewed, Notes from prior ED visits and [place] Controlled Substance Database  ____________________________________________   FINAL CLINICAL IMPRESSION(S) / ED DIAGNOSES  Final diagnoses:  Other chronic pain      NEW MEDICATIONS STARTED DURING THIS VISIT:  New Prescriptions   No medications  on file     Note:  This document was prepared using Dragon voice recognition software and may include unintentional dictation errors.    Faythe Ghee, PA-C 01/01/18 2050    Minna Antis, MD 01/01/18 [phone]

## 2018-02-26 ENCOUNTER — Other Ambulatory Visit: Payer: Self-pay

## 2018-02-26 ENCOUNTER — Emergency Department
Admission: EM | Admit: 2018-02-26 | Discharge: 2018-02-26 | Disposition: A | Discharge status: Home / Self Care | Payer: 59 | Attending: Emergency Medicine | Admitting: Emergency Medicine

## 2018-02-26 DIAGNOSIS — I10 Essential (primary) hypertension: Secondary | ICD-10-CM | POA: Insufficient documentation

## 2018-02-26 DIAGNOSIS — M7712 Lateral epicondylitis, left elbow: Secondary | ICD-10-CM | POA: Insufficient documentation

## 2018-02-26 DIAGNOSIS — J449 Chronic obstructive pulmonary disease, unspecified: Secondary | ICD-10-CM | POA: Insufficient documentation

## 2018-02-26 DIAGNOSIS — Z79899 Other long term (current) drug therapy: Secondary | ICD-10-CM | POA: Insufficient documentation

## 2018-02-26 DIAGNOSIS — F17210 Nicotine dependence, cigarettes, uncomplicated: Secondary | ICD-10-CM | POA: Insufficient documentation

## 2018-02-26 DIAGNOSIS — E119 Type 2 diabetes mellitus without complications: Secondary | ICD-10-CM | POA: Insufficient documentation

## 2018-02-26 MED ORDER — MELOXICAM 15 MG PO TABS: 15.0000 mg | ORAL_TABLET | Freq: Every day | ORAL | 0 refills | Status: DC

## 2018-02-26 NOTE — ED Triage Notes (Signed)
Pt to the er for left elbow pain x 2 months that has worsened. Pain is worse with movement.

## 2018-02-26 NOTE — ED Provider Notes (Signed)
[facility] EMERGENCY DEPARTMENT Provider Note   CSN: [id number] Arrival date & time: 02/26/18  1716     History   Chief Complaint Chief Complaint  Patient presents with  . Elbow Pain    HPI Harold Owens is a 47 y.o. male presents to the emergency department for evaluation of left elbow pain.  Elbow pain is been nontraumatic.  Pain is worse with use.  No fevers warmth redness numbness or tingling.  He describes pain of the lateral epicondyles is worse with grasping and gripping.  He has tried tennis elbow sleeve as well as some ibuprofen with little relief.  HPI  Past Medical History:  Diagnosis Date  . COPD (chronic obstructive pulmonary disease) (HCC)   . Diabetes mellitus without complication (HCC)   . Hypertension     There are no active problems to display for this patient.   Past Surgical History:  Procedure Laterality Date  . TONSILLECTOMY    . WRIST SURGERY          Home Medications    Prior to Admission medications   Medication Sig Start Date End Date Taking? Authorizing Provider  albuterol (PROVENTIL) (5 MG/ML) 0.5% nebulizer solution Inhale 2.5 mg into the lungs every 4 (four) hours as needed. 11/30/14 11/30/15  [provider]  albuterol (VENTOLIN HFA) 108 (90 Base) MCG/ACT inhaler Inhale 2 puffs into the lungs every 6 (six) hours as needed. 07/11/15 07/10/16  [provider]  econazole nitrate 1 % cream Apply 1 application topically 2 (two) times daily. 07/13/15   [provider]  HYDROcodone-acetaminophen (NORCO/VICODIN) 5-325 MG tablet Take 1 tablet by mouth every 6 (six) hours as needed for moderate pain. 02/23/17   Enid Derry, PA-C  ibuprofen (ADVIL,MOTRIN) 800 MG tablet Take 1 tablet (800 mg total) by mouth every 8 (eight) hours as needed. 02/23/17   Enid Derry, PA-C  lisinopril-hydrochlorothiazide (PRINZIDE,ZESTORETIC) 10-12.5 MG tablet Take 1 tablet by mouth daily. 07/11/15   [provider]  meloxicam (MOBIC) 15 MG tablet Take 1 tablet (15 mg total) by mouth daily. 02/26/18   Evon Slack, PA-C  predniSONE (DELTASONE) 50 MG tablet Take one 50mg  tablet once a day for five days. 03/23/17   Orvil Feil, PA-C  sildenafil (VIAGRA) 100 MG tablet Take 0.5 mg by mouth daily as needed. 11/30/14   [provider]    Family History Family History  Problem Relation Age of Onset  . Cancer Mother   . Diabetes Sister   . Hypertension Sister   . Hypertension Brother     Social History Social History   Tobacco Use  . Smoking status: Current Every Day Smoker    Packs/day: 1.00    Years: 25.00    Pack years: 25.00    Types: Cigarettes  . Smokeless tobacco: Never Used  Substance Use Topics  . Alcohol use: No    Alcohol/week: 0.0 standard drinks  . Drug use: No     Allergies   Penicillins   Review of Systems Review of Systems  Constitutional: Negative for fever.  Respiratory: Negative for shortness of breath.   Cardiovascular: Negative for chest pain.  Musculoskeletal: Positive for arthralgias and myalgias.  Skin: Negative for wound.  Neurological: Negative for numbness and headaches.     Physical Exam Updated Vital Signs BP 115/66   Pulse 73   Temp 98.6 F (37 C) (Oral)   Resp 16   Ht 5' 7.5" (1.715 m)   Wt Marland Kitchen)  141.5 kg   SpO2 98%   BMI 48.14 kg/m   Physical Exam  Constitutional: He is oriented to person, place, and time. He appears well-developed and well-nourished.  HENT:  Head: Normocephalic and atraumatic.  Eyes: Conjunctivae are normal.  Neck: Normal range of motion.  Cardiovascular: Normal rate.  Pulmonary/Chest: Effort normal. No respiratory distress.  Musculoskeletal: Normal range of motion.  Left elbow with full range of motion, tender over the lateral epicondyle.  Painful resisted wrist extension.  No warmth redness or effusion.  Sensation intact distally.  Neurological: He is alert and oriented to person, place,  and time.  Skin: Skin is warm. No rash noted.  Psychiatric: He has a normal mood and affect. His behavior is normal. Thought content normal.     ED Treatments / Results  Labs (all labs ordered are listed, but only abnormal results are displayed) Labs Reviewed - No data to display  EKG None  Radiology No results found.  Procedures Procedures (including critical care time)  Medications Ordered in ED Medications - No data to display   Initial Impression / Assessment and Plan / ED Course  I have reviewed the triage vital signs and the nursing notes.  Pertinent labs & imaging results that were available during my care of the patient were reviewed by me and considered in my medical decision making (see chart for details).     47 year old male with lateral epicondylitis.  He is encouraged to wear tennis elbow strap and shown how to wear properly.  He will take Mobic 15 mg daily x7 days.  He will avoid lifting with the palm down and limp with the palm up.  Follow-up with orthopedics if no improvement.  Patient with no signs of gout, cellulitis, bursitis or septic arthritis, cervical radiculopathy.  Final Clinical Impressions(s) / ED Diagnoses   Final diagnoses:  Lateral epicondylitis, left elbow    ED Discharge Orders         Ordered    meloxicam (MOBIC) 15 MG tablet  Daily     02/26/18 1824           Evon Slack, PA-C 02/26/18 1833    Nita Sickle, MD 03/03/18 [phone]

## 2018-02-26 NOTE — Discharge Instructions (Addendum)
Please avoid lifting with the palm down, lift with the palm up.  Please wear snug tennis elbow strap over the left elbow to prevent persistent pain.  Take Mobic daily for 7 days to help with inflammation.  If no improvement follow-up with orthopedics for cortisone injection.

## 2018-04-29 ENCOUNTER — Emergency Department: Payer: Self-pay

## 2018-04-29 ENCOUNTER — Encounter: Payer: Self-pay | Admitting: Emergency Medicine

## 2018-04-29 ENCOUNTER — Emergency Department
Admission: EM | Admit: 2018-04-29 | Discharge: 2018-04-29 | Disposition: A | Discharge status: Home / Self Care | Payer: Self-pay | Attending: Emergency Medicine | Admitting: Emergency Medicine

## 2018-04-29 ENCOUNTER — Other Ambulatory Visit: Payer: Self-pay

## 2018-04-29 DIAGNOSIS — Z79899 Other long term (current) drug therapy: Secondary | ICD-10-CM | POA: Insufficient documentation

## 2018-04-29 DIAGNOSIS — E119 Type 2 diabetes mellitus without complications: Secondary | ICD-10-CM | POA: Insufficient documentation

## 2018-04-29 DIAGNOSIS — I10 Essential (primary) hypertension: Secondary | ICD-10-CM | POA: Insufficient documentation

## 2018-04-29 DIAGNOSIS — F17210 Nicotine dependence, cigarettes, uncomplicated: Secondary | ICD-10-CM | POA: Insufficient documentation

## 2018-04-29 DIAGNOSIS — J449 Chronic obstructive pulmonary disease, unspecified: Secondary | ICD-10-CM | POA: Insufficient documentation

## 2018-04-29 DIAGNOSIS — J42 Unspecified chronic bronchitis: Secondary | ICD-10-CM

## 2018-04-29 MED ORDER — HYDROCHLOROTHIAZIDE 12.5 MG PO CAPS: 12.5000 mg | ORAL_CAPSULE | Freq: Every day | ORAL | 3 refills | Status: AC

## 2018-04-29 MED ORDER — ALBUTEROL SULFATE (2.5 MG/3ML) 0.083% IN NEBU: 2.5000 mg | INHALATION_SOLUTION | Freq: Four times a day (QID) | RESPIRATORY_TRACT | 12 refills | Status: DC | PRN

## 2018-04-29 MED ORDER — AZITHROMYCIN 250 MG PO TABS: ORAL_TABLET | ORAL | 0 refills | Status: DC

## 2018-04-29 MED ORDER — ATORVASTATIN CALCIUM 20 MG PO TABS: 20.0000 mg | ORAL_TABLET | Freq: Every day | ORAL | 3 refills | Status: AC

## 2018-04-29 MED ORDER — ALBUTEROL SULFATE (2.5 MG/3ML) 0.083% IN NEBU: 2.5000 mg | INHALATION_SOLUTION | Freq: Four times a day (QID) | RESPIRATORY_TRACT | 12 refills | Status: AC | PRN

## 2018-04-29 MED ORDER — BENZONATATE 100 MG PO CAPS: 100.0000 mg | ORAL_CAPSULE | Freq: Three times a day (TID) | ORAL | 0 refills | Status: DC | PRN

## 2018-04-29 MED ORDER — AMLODIPINE BESYLATE 5 MG PO TABS: 5.0000 mg | ORAL_TABLET | Freq: Every day | ORAL | 3 refills | Status: AC

## 2018-04-29 NOTE — ED Triage Notes (Signed)
Patient ambulatory to triage with steady gait, without difficulty or distress noted; pt reports prod cough green sputum since Thanksgiving; denies fever

## 2018-04-29 NOTE — ED Provider Notes (Signed)
[facility] Emergency Department Provider Note  ____________________________________________   First MD Initiated Contact with Patient 04/29/18 [phone]     (approximate)  I have reviewed the triage vital signs and the nursing notes.   HISTORY  Chief Complaint Cough    HPI Harold Owens is a 47 y.o. male with medical history as listed below who is currently in between primary care doctors and running out of his medications.  He presents for evaluation of a cough that he has had for about 3 weeks.  He reports that it is moderate and frequently productive of sputum but nothing particular makes it better or worse.  His worse, however, at night and sometimes he feels like he cannot breathe during the night and he chokes.  He denies fever/chills, chest pain, shortness of breath except for associated with a cough, nausea, vomiting, and abdominal pain.  He continues to smoke daily.  He is out of his albuterol nebulizer medication so he is not certain if that would help.  He says that in about 2 more months he will have health care again and plans to go back to his primary care doctor.  Past Medical History:  Diagnosis Date  . COPD (chronic obstructive pulmonary disease) (HCC)   . Diabetes mellitus without complication (HCC)   . Hypertension     There are no active problems to display for this patient.   Past Surgical History:  Procedure Laterality Date  . TONSILLECTOMY    . WRIST SURGERY      Prior to Admission medications   Medication Sig Start Date End Date Taking? Authorizing Provider  albuterol (PROVENTIL) (2.5 MG/3ML) 0.083% nebulizer solution Take 3 mLs (2.5 mg total) by nebulization every 6 (six) hours as needed for wheezing or shortness of breath. 04/29/18   Loleta Rose, MD  albuterol (VENTOLIN HFA) 108 (90 Base) MCG/ACT inhaler Inhale 2 puffs into the lungs every 6 (six) hours as needed. 07/11/15 07/10/16  [provider]  amLODipine  (NORVASC) 5 MG tablet Take 1 tablet (5 mg total) by mouth daily. 04/29/18 04/29/19  Loleta Rose, MD  atorvastatin (LIPITOR) 20 MG tablet Take 1 tablet (20 mg total) by mouth daily. 04/29/18 04/29/19  Loleta Rose, MD  azithromycin (ZITHROMAX) 250 MG tablet Take 2 tablets PO on day 1, then take 1 tablet PO daily for 4 more days 04/29/18   Loleta Rose, MD  benzonatate (TESSALON PERLES) 100 MG capsule Take 1 capsule (100 mg total) by mouth 3 (three) times daily as needed for cough. 04/29/18   Loleta Rose, MD  econazole nitrate 1 % cream Apply 1 application topically 2 (two) times daily. 07/13/15   [provider]  hydrochlorothiazide (MICROZIDE) 12.5 MG capsule Take 1 capsule (12.5 mg total) by mouth daily. 04/29/18 04/29/19  Loleta Rose, MD  HYDROcodone-acetaminophen (NORCO/VICODIN) 5-325 MG tablet Take 1 tablet by mouth every 6 (six) hours as needed for moderate pain. 02/23/17   Enid Derry, PA-C  ibuprofen (ADVIL,MOTRIN) 800 MG tablet Take 1 tablet (800 mg total) by mouth every 8 (eight) hours as needed. 02/23/17   Enid Derry, PA-C  meloxicam (MOBIC) 15 MG tablet Take 1 tablet (15 mg total) by mouth daily. 02/26/18   Evon Slack, PA-C  predniSONE (DELTASONE) 50 MG tablet Take one 50mg  tablet once a day for five days. 03/23/17   Orvil Feil, PA-C  sildenafil (VIAGRA) 100 MG tablet Take 0.5 mg by mouth daily as needed. 11/30/14   [provider]    Allergies Penicillins  Family History  Problem Relation Age of Onset  . Cancer Mother   . Diabetes Sister   . Hypertension Sister   . Hypertension Brother     Social History Social History   Tobacco Use  . Smoking status: Current Every Day Smoker    Packs/day: 1.00    Years: 25.00    Pack years: 25.00    Types: Cigarettes  . Smokeless tobacco: Never Used  Substance Use Topics  . Alcohol use: No    Alcohol/week: 0.0 standard drinks  . Drug use: No    Review of Systems Constitutional: No  fever/chills Eyes: No visual changes. ENT: No sore throat. Cardiovascular: Denies chest pain. Respiratory: Cough for about 3 weeks frequently productive of sputum.  Denies shortness of breath. Gastrointestinal: No abdominal pain.  No nausea, no vomiting.  No diarrhea.  No constipation. Genitourinary: Negative for dysuria. Musculoskeletal: Negative for neck pain.  Negative for back pain. Integumentary: Negative for rash. Neurological: Negative for headaches, focal weakness or numbness.   ____________________________________________   PHYSICAL EXAM:  VITAL SIGNS: ED Triage Vitals  Enc Vitals Group     BP 04/29/18 0337 120/74     Pulse Rate 04/29/18 0337 90     Resp 04/29/18 0337 20     Temp 04/29/18 0337 98.2 F (36.8 C)     Temp Source 04/29/18 0337 Oral     SpO2 04/29/18 0337 96 %     Weight 04/29/18 0334 (!) 145.2 kg (320 lb)     Height 04/29/18 0334 1.676 m (5\' 6" )     Head Circumference --      Peak Flow --      Pain Score 04/29/18 0334 0     Pain Loc --      Pain Edu? --      Excl. in GC? --     Constitutional: Alert and oriented. Well appearing and in no acute distress. Eyes: Conjunctivae are normal.  Head: Atraumatic. Nose: No congestion/rhinnorhea. Mouth/Throat: Mucous membranes are moist. Neck: No stridor.  No meningeal signs.   Cardiovascular: Normal rate, regular rhythm. Good peripheral circulation. Grossly normal heart sounds. Respiratory: Normal respiratory effort.  No retractions. Lungs CTAB. Gastrointestinal: Obese.  Soft and nontender. No distention.  Musculoskeletal: No lower extremity tenderness nor edema. No gross deformities of extremities. Neurologic:  Normal speech and language. No gross focal neurologic deficits are appreciated.  Skin:  Skin is warm, dry and intact. No rash noted. Psychiatric: Mood and affect are normal. Speech and behavior are normal.  ____________________________________________   LABS (all labs ordered are listed, but  only abnormal results are displayed)  Labs Reviewed - No data to display ____________________________________________  EKG  None - EKG not ordered by ED physician ____________________________________________  RADIOLOGY I, Loleta Roseory Barbi Kumagai, personally viewed and evaluated these images (plain radiographs) as part of my medical decision making, as well as reviewing the written report by the radiologist.  ED MD interpretation: No indication of pneumonia or pulmonary edema  Official radiology report(s): Dg Chest 2 View  Result Date: 04/29/2018 CLINICAL DATA:  47 year old male with cough. EXAM: CHEST - 2 VIEW COMPARISON:  Chest radiograph dated 03/06/2015 FINDINGS: The heart size and mediastinal contours are within normal limits. Both lungs are clear. The visualized skeletal structures are unremarkable. IMPRESSION: No active cardiopulmonary disease. Electronically Signed   By: Elgie CollardArash  Radparvar M.D.   On: 04/29/2018 04:12    ____________________________________________   PROCEDURES  Critical Care performed:  No   Procedure(s) performed:   Procedures   ____________________________________________   INITIAL IMPRESSION / ASSESSMENT AND PLAN / ED COURSE  As part of my medical decision making, I reviewed the following data within the electronic MEDICAL RECORD NUMBER Nursing notes reviewed and incorporated, Radiograph reviewed  and Notes from prior ED visits    The patient has chronic bronchitis and a viral illness with a persistent cough.  No evidence of pneumonia.  His vital signs are reassuring.  His lung sounds are actually clear at this time.  I am providing prescriptions as listed below to help with the persistent cough and the possibility he could have a mild superinfection as well as to help control his blood pressure and cholesterol.  Of note, I am switching the patient off of lisinopril and onto amlodipine given the high prevalence of angioedema in this geographic area and  African-Americans taking lisinopril.  I explained why I change the medications and he understands and agrees with the plan.     ____________________________________________  FINAL CLINICAL IMPRESSION(S) / ED DIAGNOSES  Final diagnoses:  Chronic bronchitis, unspecified chronic bronchitis type (HCC)  Essential hypertension     MEDICATIONS GIVEN DURING THIS VISIT:  Medications - No data to display   ED Discharge Orders         Ordered    albuterol (PROVENTIL) (2.5 MG/3ML) 0.083% nebulizer solution  Every 6 hours PRN,   Status:  Discontinued     04/29/18 0504    hydrochlorothiazide (MICROZIDE) 12.5 MG capsule  Daily     04/29/18 0504    amLODipine (NORVASC) 5 MG tablet  Daily     04/29/18 0504    atorvastatin (LIPITOR) 20 MG tablet  Daily     04/29/18 0504    azithromycin (ZITHROMAX) 250 MG tablet     04/29/18 0504    benzonatate (TESSALON PERLES) 100 MG capsule  3 times daily PRN     04/29/18 0504    albuterol (PROVENTIL) (2.5 MG/3ML) 0.083% nebulizer solution  Every 6 hours PRN     04/29/18 0506           Note:  This document was prepared using Dragon voice recognition software and may include unintentional dictation errors.    Loleta Rose, MD 04/29/18 [phone]

## 2018-06-03 ENCOUNTER — Encounter: Payer: Self-pay | Admitting: Emergency Medicine

## 2018-06-03 ENCOUNTER — Emergency Department
Admission: EM | Admit: 2018-06-03 | Discharge: 2018-06-03 | Disposition: A | Discharge status: Home / Self Care | Payer: Self-pay | Attending: Student in an Organized Health Care Education/Training Program | Admitting: Student in an Organized Health Care Education/Training Program

## 2018-06-03 ENCOUNTER — Emergency Department: Payer: Self-pay

## 2018-06-03 ENCOUNTER — Other Ambulatory Visit: Payer: Self-pay

## 2018-06-03 DIAGNOSIS — S93401A Sprain of unspecified ligament of right ankle, initial encounter: Secondary | ICD-10-CM | POA: Insufficient documentation

## 2018-06-03 DIAGNOSIS — I10 Essential (primary) hypertension: Secondary | ICD-10-CM | POA: Insufficient documentation

## 2018-06-03 DIAGNOSIS — Y998 Other external cause status: Secondary | ICD-10-CM | POA: Insufficient documentation

## 2018-06-03 DIAGNOSIS — Y9289 Other specified places as the place of occurrence of the external cause: Secondary | ICD-10-CM | POA: Insufficient documentation

## 2018-06-03 DIAGNOSIS — F17210 Nicotine dependence, cigarettes, uncomplicated: Secondary | ICD-10-CM | POA: Insufficient documentation

## 2018-06-03 DIAGNOSIS — E119 Type 2 diabetes mellitus without complications: Secondary | ICD-10-CM | POA: Insufficient documentation

## 2018-06-03 DIAGNOSIS — X501XXA Overexertion from prolonged static or awkward postures, initial encounter: Secondary | ICD-10-CM | POA: Insufficient documentation

## 2018-06-03 DIAGNOSIS — Y9389 Activity, other specified: Secondary | ICD-10-CM | POA: Insufficient documentation

## 2018-06-03 DIAGNOSIS — Z79899 Other long term (current) drug therapy: Secondary | ICD-10-CM | POA: Insufficient documentation

## 2018-06-03 DIAGNOSIS — S82891A Other fracture of right lower leg, initial encounter for closed fracture: Secondary | ICD-10-CM | POA: Insufficient documentation

## 2018-06-03 DIAGNOSIS — J449 Chronic obstructive pulmonary disease, unspecified: Secondary | ICD-10-CM | POA: Insufficient documentation

## 2018-06-03 MED ORDER — MELOXICAM 15 MG PO TABS: 15.0000 mg | ORAL_TABLET | Freq: Every day | ORAL | 0 refills | Status: DC

## 2018-06-03 NOTE — ED Provider Notes (Signed)
[facility] Emergency Department Provider Note  ____________________________________________  Time seen: Approximately 4:25 PM  I have reviewed the triage vital signs and the nursing notes.   HISTORY  Chief Complaint Ankle Pain    HPI Harold Owens is a 48 y.o. male who presents the emergency department complaining of right ankle pain.  Patient reports that he was moving a washer machine down a flight of stairs when he rolled his ankle.  Patient reports he was going backwards, missed the last step and rolled his ankle.  Initially, patient reports that it hurts slightly but over the intervening.  Pain has persisted and worsened.  Patient is still able to bear weight on the ankle but doing so increases the pain.  He is using Ace bandage, Tylenol, and 100 mg IV Profen, ice with no relief.  Patient denies any history of surgery to the ankle.  No history of gout.  No other complaints at this time.  Patient has a history of COPD, diabetes, hypertension; no complaints with chronic medical problems.    Past Medical History:  Diagnosis Date  . COPD (chronic obstructive pulmonary disease) (HCC)   . Diabetes mellitus without complication (HCC)   . Hypertension     There are no active problems to display for this patient.   Past Surgical History:  Procedure Laterality Date  . TONSILLECTOMY    . WRIST SURGERY      Prior to Admission medications   Medication Sig Start Date End Date Taking? Authorizing Provider  albuterol (PROVENTIL) (2.5 MG/3ML) 0.083% nebulizer solution Take 3 mLs (2.5 mg total) by nebulization every 6 (six) hours as needed for wheezing or shortness of breath. 04/29/18   Loleta RoseForbach, Cory, MD  albuterol (VENTOLIN HFA) 108 (90 Base) MCG/ACT inhaler Inhale 2 puffs into the lungs every 6 (six) hours as needed. 07/11/15 07/10/16  [provider]  amLODipine (NORVASC) 5 MG tablet Take 1 tablet (5 mg total) by mouth daily. 04/29/18 04/29/19   Loleta RoseForbach, Cory, MD  atorvastatin (LIPITOR) 20 MG tablet Take 1 tablet (20 mg total) by mouth daily. 04/29/18 04/29/19  Loleta RoseForbach, Cory, MD  azithromycin (ZITHROMAX) 250 MG tablet Take 2 tablets PO on day 1, then take 1 tablet PO daily for 4 more days 04/29/18   Loleta RoseForbach, Cory, MD  benzonatate (TESSALON PERLES) 100 MG capsule Take 1 capsule (100 mg total) by mouth 3 (three) times daily as needed for cough. 04/29/18   Loleta RoseForbach, Cory, MD  econazole nitrate 1 % cream Apply 1 application topically 2 (two) times daily. 07/13/15   [provider]  hydrochlorothiazide (MICROZIDE) 12.5 MG capsule Take 1 capsule (12.5 mg total) by mouth daily. 04/29/18 04/29/19  Loleta RoseForbach, Cory, MD  HYDROcodone-acetaminophen (NORCO/VICODIN) 5-325 MG tablet Take 1 tablet by mouth every 6 (six) hours as needed for moderate pain. 02/23/17   Enid DerryWagner, Ashley, PA-C  ibuprofen (ADVIL,MOTRIN) 800 MG tablet Take 1 tablet (800 mg total) by mouth every 8 (eight) hours as needed. 02/23/17   Enid DerryWagner, Ashley, PA-C  meloxicam (MOBIC) 15 MG tablet Take 1 tablet (15 mg total) by mouth daily. 06/03/18   , Delorise RoyalsJonathan D, PA-C  predniSONE (DELTASONE) 50 MG tablet Take one 50mg  tablet once a day for five days. 03/23/17   Orvil FeilWoods, Jaclyn M, PA-C  sildenafil (VIAGRA) 100 MG tablet Take 0.5 mg by mouth daily as needed. 11/30/14   [provider]    Allergies Penicillins  Family History  Problem Relation Age of Onset  . Cancer Mother   .  Diabetes Sister   . Hypertension Sister   . Hypertension Brother     Social History Social History   Tobacco Use  . Smoking status: Current Every Day Smoker    Packs/day: 1.00    Years: 25.00    Pack years: 25.00    Types: Cigarettes  . Smokeless tobacco: Never Used  Substance Use Topics  . Alcohol use: No    Alcohol/week: 0.0 standard drinks  . Drug use: No     Review of Systems  Constitutional: No fever/chills Eyes: No visual changes.  Cardiovascular: no chest pain. Respiratory:  no cough. No SOB. Gastrointestinal: No abdominal pain.  No nausea, no vomiting.   Musculoskeletal: Positive for right ankle pain Skin: Negative for rash, abrasions, lacerations, ecchymosis. Neurological: Negative for headaches, focal weakness or numbness. 10-point ROS otherwise negative.  ____________________________________________   PHYSICAL EXAM:  VITAL SIGNS: ED Triage Vitals  Enc Vitals Group     BP 06/03/18 1527 (!) 162/89     Pulse Rate 06/03/18 1527 80     Resp 06/03/18 1527 18     Temp 06/03/18 1527 98.3 F (36.8 C)     Temp Source 06/03/18 1527 Oral     SpO2 06/03/18 1527 98 %     Weight 06/03/18 1528 (!) 325 lb (147.4 kg)     Height 06/03/18 1528 5\' 6"  (1.676 m)     Head Circumference --      Peak Flow --      Pain Score 06/03/18 1533 9     Pain Loc --      Pain Edu? --      Excl. in GC? --      Constitutional: Alert and oriented. Well appearing and in no acute distress. Eyes: Conjunctivae are normal. PERRL. EOMI. Head: Atraumatic. Neck: No stridor.    Cardiovascular: Normal rate, regular rhythm. Normal S1 and S2.  Good peripheral circulation. Respiratory: Normal respiratory effort without tachypnea or retractions. Lungs CTAB. Good air entry to the bases with no decreased or absent breath sounds. Musculoskeletal: Full range of motion to all extremities. No gross deformities appreciated.  Visualization of the right ankle reveals no gross edema, erythema, ecchymosis.  Patient is able to extend, flex the ankle appropriately.  Patient is tender to palpation along the talofibular joint line.  No other significant tenderness to palpation.  No palpable abnormality or deficit.  Dorsalis pedis pulse intact.  Sensation intact all 5 digits. Neurologic:  Normal speech and language. No gross focal neurologic deficits are appreciated.  Skin:  Skin is warm, dry and intact. No rash noted. Psychiatric: Mood and affect are normal. Speech and behavior are normal. Patient exhibits  appropriate insight and judgement.   ____________________________________________   LABS (all labs ordered are listed, but only abnormal results are displayed)  Labs Reviewed - No data to display ____________________________________________  EKG   ____________________________________________  RADIOLOGY I personally viewed and evaluated these images as part of my medical decision making, as well as reviewing the written report by the radiologist.  I concur with radiologist finding of no acute osseous abnormality.  Apparent previous avulsion fracture injury identified.  This is well-corticated and likely from previous injury.  Dg Ankle Complete Right  Result Date: 06/03/2018 CLINICAL DATA:  48 year old male status post twisting injury moving furniture 2 weeks ago with persistent pain and swelling. EXAM: RIGHT ANKLE - COMPLETE 3+ VIEW COMPARISON:  None. FINDINGS: Bone mineralization is within normal limits. Preserved mortise joint alignment. Talar dome intact. No  definite joint effusion. Mild calcaneus degenerative spurring, calcaneus intact. There is a small nonacute ossific fragment situated between the lateral malleolus and lateral talus (arrow). No acute fracture or dislocation identified. IMPRESSION: No acute fracture or dislocation identified about the right ankle. Sequelae of prior avulsion suspected along the lateral joint. Electronically Signed   By: Odessa Fleming M.D.   On: 06/03/2018 16:05    ____________________________________________    PROCEDURES  Procedure(s) performed:    Procedures    Medications - No data to display   ____________________________________________   INITIAL IMPRESSION / ASSESSMENT AND PLAN / ED COURSE  Pertinent labs & imaging results that were available during my care of the patient were reviewed by me and considered in my medical decision making (see chart for details).  Review of the [place] CSRS was performed in accordance of the [facility] prior to  dispensing any controlled drugs.      Patient's diagnosis is consistent with sprain of the right ankle, closed avulsion fracture of the right ankle.  Patient presents emergency department with increased pain from a ankle sprain approximately 2 weeks ago.  Symptoms have worsened instead of improving.  Patient has been using Tylenol, Motrin, Ace bandage, ice pack.  On exam, exam is overall reassuring.  Patient does have evidence of a previous avulsion fracture.  Is this well-corticated, likely not from this injury, however patient is tender to palpation in this region.  This is likely an aggravation of previous avulsion fracture.  Patient will be given removable ankle stirrup splint, crutches to assist with ambulation and a prescription for meloxicam.  Follow-up podiatry if symptoms continue to persist or worsen..  Patient is given ED precautions to return to the ED for any worsening or new symptoms.     ____________________________________________  FINAL CLINICAL IMPRESSION(S) / ED DIAGNOSES  Final diagnoses:  Sprain of right ankle, unspecified ligament, initial encounter  Closed avulsion fracture of right ankle, initial encounter      NEW MEDICATIONS STARTED DURING THIS VISIT:  ED Discharge Orders         Ordered    meloxicam (MOBIC) 15 MG tablet  Daily     06/03/18 1637              This chart was dictated using voice recognition software/Dragon. Despite best efforts to proofread, errors can occur which can change the meaning. Any change was purely unintentional.    Racheal Patches, PA-C 06/03/18 1637    Willy Eddy, MD 06/03/18 1751

## 2018-06-03 NOTE — ED Triage Notes (Signed)
FIRST NURSE NOTE-twisted ankle moving furniture.  Ambulatory to check in.

## 2018-06-03 NOTE — ED Triage Notes (Signed)
Pt in via POV, reports incident where he rolled his ankle 2 weeks ago while moving furniture.  Pt reports ongoing pain since, using ace wrap, ice, tylenol without any relief.  Pt ambulatory to triage.  NAD noted at this time.

## 2018-07-24 ENCOUNTER — Ambulatory Visit: Payer: Self-pay | Admitting: Pharmacy Technician

## 2018-07-24 ENCOUNTER — Encounter (INDEPENDENT_AMBULATORY_CARE_PROVIDER_SITE_OTHER): Payer: Self-pay

## 2018-07-24 DIAGNOSIS — Z79899 Other long term (current) drug therapy: Secondary | ICD-10-CM

## 2018-07-24 NOTE — Progress Notes (Signed)
Met with patient completed financial assistance application for [place] due to recent ED visit.  Patient agreed to be responsible for gathering financial information and forwarding to appropriate department in [facility].    Completed [facility] application and contract.  Patient agreed to all terms of the [facility] contract.    Patient approved to receive medication assistance at [facility] as long as eligibility criteria continues to be met.   Provided patient with Civil engineer, contracting based on his particular needs.    Referred patient for [facility] & [facility].  Patient unemployed.  Interested in learning new skill set to allow for more job opportunities.  Sent referral to [facility]  Also, provided patient with information about [facility] located in [place].  [facility]

## 2018-08-07 ENCOUNTER — Emergency Department: Payer: Self-pay

## 2018-08-07 ENCOUNTER — Emergency Department
Admission: EM | Admit: 2018-08-07 | Discharge: 2018-08-07 | Disposition: A | Discharge status: Home / Self Care | Payer: Self-pay | Attending: Student in an Organized Health Care Education/Training Program | Admitting: Student in an Organized Health Care Education/Training Program

## 2018-08-07 ENCOUNTER — Other Ambulatory Visit: Payer: Self-pay

## 2018-08-07 DIAGNOSIS — J449 Chronic obstructive pulmonary disease, unspecified: Secondary | ICD-10-CM | POA: Insufficient documentation

## 2018-08-07 DIAGNOSIS — I10 Essential (primary) hypertension: Secondary | ICD-10-CM | POA: Insufficient documentation

## 2018-08-07 DIAGNOSIS — E119 Type 2 diabetes mellitus without complications: Secondary | ICD-10-CM | POA: Insufficient documentation

## 2018-08-07 DIAGNOSIS — B349 Viral infection, unspecified: Secondary | ICD-10-CM | POA: Insufficient documentation

## 2018-08-07 DIAGNOSIS — F17210 Nicotine dependence, cigarettes, uncomplicated: Secondary | ICD-10-CM | POA: Insufficient documentation

## 2018-08-07 DIAGNOSIS — R197 Diarrhea, unspecified: Secondary | ICD-10-CM | POA: Insufficient documentation

## 2018-08-07 LAB — CBC WITH DIFFERENTIAL/PLATELET
Abs Immature Granulocytes: 0.02 10*3/uL (ref 0.00–0.07)
Basophils Absolute: 0 10*3/uL (ref 0.0–0.1)
Basophils Relative: 0 %
Eosinophils Absolute: 0.2 10*3/uL (ref 0.0–0.5)
Eosinophils Relative: 2 %
HCT: 47.8 % (ref 39.0–52.0)
Hemoglobin: 15.7 g/dL (ref 13.0–17.0)
Immature Granulocytes: 0 %
Lymphocytes Relative: 14 %
Lymphs Abs: 1.1 10*3/uL (ref 0.7–4.0)
MCH: 29.7 pg (ref 26.0–34.0)
MCHC: 32.8 g/dL (ref 30.0–36.0)
MCV: 90.4 fL (ref 80.0–100.0)
Monocytes Absolute: 0.6 10*3/uL (ref 0.1–1.0)
Monocytes Relative: 8 %
Neutro Abs: 6 10*3/uL (ref 1.7–7.7)
Neutrophils Relative %: 76 %
Platelets: 242 10*3/uL (ref 150–400)
RBC: 5.29 MIL/uL (ref 4.22–5.81)
RDW: 14.2 % (ref 11.5–15.5)
WBC: 8 10*3/uL (ref 4.0–10.5)
nRBC: 0 % (ref 0.0–0.2)

## 2018-08-07 LAB — COMPREHENSIVE METABOLIC PANEL
ALT: 42 U/L (ref 0–44)
AST: 31 U/L (ref 15–41)
Albumin: 3.9 g/dL (ref 3.5–5.0)
Alkaline Phosphatase: 51 U/L (ref 38–126)
Anion gap: 8 (ref 5–15)
BUN: 16 mg/dL (ref 6–20)
CO2: 29 mmol/L (ref 22–32)
Calcium: 8.9 mg/dL (ref 8.9–10.3)
Chloride: 100 mmol/L (ref 98–111)
Creatinine, Ser: 0.9 mg/dL (ref 0.61–1.24)
GFR calc Af Amer: >60 mL/min (ref >60)
GFR calc non Af Amer: >60 mL/min (ref >60)
Glucose, Bld: 133 mg/dL — ABNORMAL HIGH (ref 70–99)
Potassium: 3.3 mmol/L — ABNORMAL LOW (ref 3.5–5.1)
Sodium: 137 mmol/L (ref 135–145)
Total Bilirubin: 0.7 mg/dL (ref 0.3–1.2)
Total Protein: 7.7 g/dL (ref 6.5–8.1)

## 2018-08-07 LAB — INFLUENZA PANEL BY PCR (TYPE A & B)
Influenza A By PCR: NEGATIVE
Influenza B By PCR: NEGATIVE

## 2018-08-07 MED ORDER — ONDANSETRON HCL 4 MG/2ML IJ SOLN
4.0000 mg | Freq: Once | INTRAMUSCULAR | Status: AC
  Administered 2018-08-07: 4 mg via INTRAVENOUS
  Filled 2018-08-07: qty 2

## 2018-08-07 MED ORDER — SODIUM CHLORIDE 0.9 % IV BOLUS
500.0000 mL | Freq: Once | INTRAVENOUS | Status: AC
  Administered 2018-08-07: 500 mL via INTRAVENOUS

## 2018-08-07 MED ORDER — ONDANSETRON 4 MG PO TBDP: 4.0000 mg | ORAL_TABLET | Freq: Three times a day (TID) | ORAL | 0 refills | Status: DC | PRN

## 2018-08-07 NOTE — ED Notes (Signed)
Patient transported to X-ray 

## 2018-08-07 NOTE — ED Triage Notes (Signed)
Pt arrives via pov from home, states vomiting started yesterday and diarrhea, pt reports pain in his rt wrist and ankle, states that his wife just got over something similar

## 2018-08-07 NOTE — Discharge Instructions (Signed)
Please be sure to drink plenty of fluids.  Return for worsening symptoms.  Your chest x-ray did not show any evidence of pneumonia and your blood work is reassuring.  As we discussed you do not meet criteria per [facility] policy, for coronavirus testing.  If you are still interested in being testing there is drive-through testing available at [facility] in [place] as well as at [facility].

## 2018-08-07 NOTE — ED Provider Notes (Signed)
[facility] Emergency Department Provider Note    First MD Initiated Contact with Patient 08/07/18 [phone]     (approximate)  I have reviewed the triage vital signs and the nursing notes.   HISTORY  Chief Complaint Emesis; Diarrhea; and Generalized Body Aches    HPI Harold Owens is a 48 y.o. male listed past medical history presents the ER for 24 hours of chills at home associated with nausea vomiting and diarrhea.  States he is also had a nonproductive cough.  No known covid 19 exposures.  No recent travel.  Is also having nonbloody diarrhea.  No recent antibiotics.  He states that he is having some myalgias and aches.    Past Medical History:  Diagnosis Date  . COPD (chronic obstructive pulmonary disease) (HCC)   . Diabetes mellitus without complication (HCC)   . Hypertension    Family History  Problem Relation Age of Onset  . Cancer Mother   . Diabetes Sister   . Hypertension Sister   . Hypertension Brother    Past Surgical History:  Procedure Laterality Date  . TONSILLECTOMY    . WRIST SURGERY     There are no active problems to display for this patient.     Prior to Admission medications   Medication Sig Start Date End Date Taking? Authorizing Provider  albuterol (PROVENTIL) (2.5 MG/3ML) 0.083% nebulizer solution Take 3 mLs (2.5 mg total) by nebulization every 6 (six) hours as needed for wheezing or shortness of breath. 04/29/18   Loleta Rose, MD  albuterol (VENTOLIN HFA) 108 (90 Base) MCG/ACT inhaler Inhale 2 puffs into the lungs every 6 (six) hours as needed. 07/11/15 07/10/16  [provider]  amLODipine (NORVASC) 5 MG tablet Take 1 tablet (5 mg total) by mouth daily. 04/29/18 04/29/19  Loleta Rose, MD  atorvastatin (LIPITOR) 20 MG tablet Take 1 tablet (20 mg total) by mouth daily. 04/29/18 04/29/19  Loleta Rose, MD  azithromycin (ZITHROMAX) 250 MG tablet Take 2 tablets PO on day 1, then take 1 tablet PO daily for 4  more days 04/29/18   Loleta Rose, MD  benzonatate (TESSALON PERLES) 100 MG capsule Take 1 capsule (100 mg total) by mouth 3 (three) times daily as needed for cough. 04/29/18   Loleta Rose, MD  econazole nitrate 1 % cream Apply 1 application topically 2 (two) times daily. 07/13/15   [provider]  hydrochlorothiazide (MICROZIDE) 12.5 MG capsule Take 1 capsule (12.5 mg total) by mouth daily. 04/29/18 04/29/19  Loleta Rose, MD  HYDROcodone-acetaminophen (NORCO/VICODIN) 5-325 MG tablet Take 1 tablet by mouth every 6 (six) hours as needed for moderate pain. 02/23/17   Enid Derry, PA-C  ibuprofen (ADVIL,MOTRIN) 800 MG tablet Take 1 tablet (800 mg total) by mouth every 8 (eight) hours as needed. 02/23/17   Enid Derry, PA-C  meloxicam (MOBIC) 15 MG tablet Take 1 tablet (15 mg total) by mouth daily. 06/03/18   Cuthriell, Delorise Royals, PA-C  predniSONE (DELTASONE) 50 MG tablet Take one 50mg  tablet once a day for five days. 03/23/17   Orvil Feil, PA-C  sildenafil (VIAGRA) 100 MG tablet Take 0.5 mg by mouth daily as needed. 11/30/14   [provider]    Allergies Penicillins    Social History Social History   Tobacco Use  . Smoking status: Current Every Day Smoker    Packs/day: 1.00    Years: 25.00    Pack years: 25.00    Types: Cigarettes  . Smokeless  tobacco: Never Used  Substance Use Topics  . Alcohol use: No    Alcohol/week: 0.0 standard drinks  . Drug use: No    Review of Systems Patient denies headaches, rhinorrhea, blurry vision, numbness, shortness of breath, chest pain, edema, cough, abdominal pain, nausea, vomiting, diarrhea, dysuria, fevers, rashes or hallucinations unless otherwise stated above in HPI. ____________________________________________   PHYSICAL EXAM:  VITAL SIGNS: Vitals:   08/07/18 0911  BP: 134/80  Pulse: 92  Resp: 18  Temp: 99.1 F (37.3 C)  SpO2: 98%    Constitutional: Alert and oriented.  Eyes: Conjunctivae are  normal.  Head: Atraumatic. Nose: No congestion/rhinnorhea. Mouth/Throat: Mucous membranes are moist.   Neck: No stridor. Painless ROM.  Cardiovascular: Normal rate, regular rhythm. Grossly normal heart sounds.  Good peripheral circulation. Respiratory: Normal respiratory effort.  No retractions. Lungs CTAB. Gastrointestinal: Soft and nontender. No distention. No abdominal bruits. No CVA tenderness. Genitourinary:  Musculoskeletal: No lower extremity tenderness nor edema.  No joint effusions. Neurologic:  Normal speech and language. No gross focal neurologic deficits are appreciated. No facial droop Skin:  Skin is warm, dry and intact. No rash noted. Psychiatric: Mood and affect are normal. Speech and behavior are normal.  ____________________________________________   LABS (all labs ordered are listed, but only abnormal results are displayed)  Results for orders placed or performed during the hospital encounter of 08/07/18 (from the past 24 hour(s))  CBC with Differential/Platelet     Status: None   Collection Time: 08/07/18  9:16 AM  Result Value Ref Range   WBC 8.0 4.0 - 10.5 K/uL   RBC 5.29 4.22 - 5.81 MIL/uL   Hemoglobin 15.7 13.0 - 17.0 g/dL   HCT 25.1 89.8 - 42.1 %   MCV 90.4 80.0 - 100.0 fL   MCH 29.7 26.0 - 34.0 pg   MCHC 32.8 30.0 - 36.0 g/dL   RDW 03.1 28.1 - 18.8 %   Platelets 242 150 - 400 K/uL   nRBC 0.0 0.0 - 0.2 %   Neutrophils Relative % 76 %   Neutro Abs 6.0 1.7 - 7.7 K/uL   Lymphocytes Relative 14 %   Lymphs Abs 1.1 0.7 - 4.0 K/uL   Monocytes Relative 8 %   Monocytes Absolute 0.6 0.1 - 1.0 K/uL   Eosinophils Relative 2 %   Eosinophils Absolute 0.2 0.0 - 0.5 K/uL   Basophils Relative 0 %   Basophils Absolute 0.0 0.0 - 0.1 K/uL   Immature Granulocytes 0 %   Abs Immature Granulocytes 0.02 0.00 - 0.07 K/uL  Comprehensive metabolic panel     Status: Abnormal   Collection Time: 08/07/18  9:16 AM  Result Value Ref Range   Sodium 137 135 - 145 mmol/L    Potassium 3.3 (L) 3.5 - 5.1 mmol/L   Chloride 100 98 - 111 mmol/L   CO2 29 22 - 32 mmol/L   Glucose, Bld 133 (H) 70 - 99 mg/dL   BUN 16 6 - 20 mg/dL   Creatinine, Ser 6.77 0.61 - 1.24 mg/dL   Calcium 8.9 8.9 - 37.3 mg/dL   Total Protein 7.7 6.5 - 8.1 g/dL   Albumin 3.9 3.5 - 5.0 g/dL   AST 31 15 - 41 U/L   ALT 42 0 - 44 U/L   Alkaline Phosphatase 51 38 - 126 U/L   Total Bilirubin 0.7 0.3 - 1.2 mg/dL   GFR calc non Af Amer >60 >60 mL/min   GFR calc Af Amer >60 >60 mL/min  Anion gap 8 5 - 15  Influenza panel by PCR (type A & B)     Status: None   Collection Time: 08/07/18  9:18 AM  Result Value Ref Range   Influenza A By PCR NEGATIVE NEGATIVE   Influenza B By PCR NEGATIVE NEGATIVE   ____________________________________________  EKG____________________________________________  RADIOLOGY  I personally reviewed all radiographic images ordered to evaluate for the above acute complaints and reviewed radiology reports and findings.  These findings were personally discussed with the patient.  Please see medical record for radiology report.  ____________________________________________   PROCEDURES  Procedure(s) performed:  Procedures    Critical Care performed: no ____________________________________________   INITIAL IMPRESSION / ASSESSMENT AND PLAN / ED COURSE  Pertinent labs & imaging results that were available during my care of the patient were reviewed by me and considered in my medical decision making (see chart for details).   DDX: Enteritis, colitis, dehydration, electrolyte abnormality, hypoglycemia, pneumonia, flu, covid 19  Harold Owens is a 48 y.o. who presents to the ED with symptoms as described above.  Patient nontoxic-appearing.  He is ambulating in the room in no respiratory distress.  Based on his history particular diabetes blood work will be ordered to evaluate for any electrolyte abnormality.  Will provide IV fluids.  His abdominal exam is soft  and benign.  Will check for flu.  Will check x-ray to evaluate for any infiltrates.  Clinical Course as of Aug 07 1103  Fri Aug 07, 2018  1056 Patient reassessed.  Feels improved after fluids.  Blood work is reassuring.  No evidence of infiltrate on chest x-ray.  Patient does not meet to urea for corona testing per [facility] policy.  Discussed the importance of self quarantine measures as well as options for drive-through testing at [facility] in [place].  discussed signs and sx for which she should return immediately to the hospital.   [PR]    Clinical Course User Index [PR] Willy Eddy, MD     As part of my medical decision making, I reviewed the following data within the electronic MEDICAL RECORD NUMBER Nursing notes reviewed and incorporated, Labs reviewed, notes from prior ED visits and [place] Controlled Substance Database   ____________________________________________   FINAL CLINICAL IMPRESSION(S) / ED DIAGNOSES  Final diagnoses:  Diarrhea, unspecified type      NEW MEDICATIONS STARTED DURING THIS VISIT:  New Prescriptions   No medications on file     Note:  This document was prepared using Dragon voice recognition software and may include unintentional dictation errors.    Willy Eddy, MD 08/07/18 1105

## 2018-08-27 ENCOUNTER — Emergency Department
Admission: EM | Admit: 2018-08-27 | Discharge: 2018-08-27 | Disposition: A | Discharge status: Home / Self Care | Payer: BLUE CROSS/BLUE SHIELD | Attending: Emergency Medicine | Admitting: Emergency Medicine

## 2018-08-27 ENCOUNTER — Encounter: Payer: Self-pay | Admitting: *Deleted

## 2018-08-27 ENCOUNTER — Other Ambulatory Visit: Payer: Self-pay

## 2018-08-27 ENCOUNTER — Emergency Department: Payer: BLUE CROSS/BLUE SHIELD

## 2018-08-27 DIAGNOSIS — I10 Essential (primary) hypertension: Secondary | ICD-10-CM | POA: Insufficient documentation

## 2018-08-27 DIAGNOSIS — M654 Radial styloid tenosynovitis [de Quervain]: Secondary | ICD-10-CM | POA: Diagnosis not present

## 2018-08-27 DIAGNOSIS — M25531 Pain in right wrist: Secondary | ICD-10-CM | POA: Diagnosis present

## 2018-08-27 DIAGNOSIS — Z79899 Other long term (current) drug therapy: Secondary | ICD-10-CM | POA: Diagnosis not present

## 2018-08-27 DIAGNOSIS — J449 Chronic obstructive pulmonary disease, unspecified: Secondary | ICD-10-CM | POA: Insufficient documentation

## 2018-08-27 DIAGNOSIS — E119 Type 2 diabetes mellitus without complications: Secondary | ICD-10-CM | POA: Diagnosis not present

## 2018-08-27 DIAGNOSIS — F17210 Nicotine dependence, cigarettes, uncomplicated: Secondary | ICD-10-CM | POA: Diagnosis not present

## 2018-08-27 MED ORDER — MELOXICAM 7.5 MG PO TABS: 7.5000 mg | ORAL_TABLET | Freq: Every day | ORAL | 0 refills | Status: DC

## 2018-08-27 NOTE — ED Provider Notes (Signed)
[facility] EMERGENCY DEPARTMENT Provider Note   CSN: [id number] Arrival date & time: 08/27/18  2157    History   Chief Complaint Chief Complaint  Patient presents with  . Wrist Pain    HPI Harold Owens is a 48 y.o. male presents to the emergency department evaluation right wrist pain.  He has a history of left wrist de Quervain's tendinitis which resolved well after surgery.  He has been performing a lot of repetitive range of motion of the last couple of weeks and has had similar symptoms on the right wrist he complains of pain along the radial styloid he has pain with grasping and gripping.  No numbness or tingling.  No trauma or injury.  He denies any warmth redness or fevers.     HPI  Past Medical History:  Diagnosis Date  . COPD (chronic obstructive pulmonary disease) (HCC)   . Diabetes mellitus without complication (HCC)   . Hypertension     There are no active problems to display for this patient.   Past Surgical History:  Procedure Laterality Date  . TONSILLECTOMY    . WRIST SURGERY          Home Medications    Prior to Admission medications   Medication Sig Start Date End Date Taking? Authorizing Provider  albuterol (PROVENTIL) (2.5 MG/3ML) 0.083% nebulizer solution Take 3 mLs (2.5 mg total) by nebulization every 6 (six) hours as needed for wheezing or shortness of breath. 04/29/18   Loleta RoseForbach, Cory, MD  albuterol (VENTOLIN HFA) 108 (90 Base) MCG/ACT inhaler Inhale 2 puffs into the lungs every 6 (six) hours as needed. 07/11/15 07/10/16  [provider]  amLODipine (NORVASC) 5 MG tablet Take 1 tablet (5 mg total) by mouth daily. 04/29/18 04/29/19  Loleta RoseForbach, Cory, MD  atorvastatin (LIPITOR) 20 MG tablet Take 1 tablet (20 mg total) by mouth daily. 04/29/18 04/29/19  Loleta RoseForbach, Cory, MD  azithromycin (ZITHROMAX) 250 MG tablet Take 2 tablets PO on day 1, then take 1 tablet PO daily for 4 more days 04/29/18   Loleta RoseForbach, Cory, MD   benzonatate (TESSALON PERLES) 100 MG capsule Take 1 capsule (100 mg total) by mouth 3 (three) times daily as needed for cough. 04/29/18   Loleta RoseForbach, Cory, MD  econazole nitrate 1 % cream Apply 1 application topically 2 (two) times daily. 07/13/15   [provider]  hydrochlorothiazide (MICROZIDE) 12.5 MG capsule Take 1 capsule (12.5 mg total) by mouth daily. 04/29/18 04/29/19  Loleta RoseForbach, Cory, MD  HYDROcodone-acetaminophen (NORCO/VICODIN) 5-325 MG tablet Take 1 tablet by mouth every 6 (six) hours as needed for moderate pain. 02/23/17   Enid DerryWagner, Ashley, PA-C  ibuprofen (ADVIL,MOTRIN) 800 MG tablet Take 1 tablet (800 mg total) by mouth every 8 (eight) hours as needed. 02/23/17   Enid DerryWagner, Ashley, PA-C  meloxicam (MOBIC) 7.5 MG tablet Take 1 tablet (7.5 mg total) by mouth daily. 08/27/18 08/27/19  Evon SlackGaines, Thomas C, PA-C  ondansetron (ZOFRAN ODT) 4 MG disintegrating tablet Take 1 tablet (4 mg total) by mouth every 8 (eight) hours as needed for nausea or vomiting. 08/07/18   Willy Eddyobinson, Patrick, MD  predniSONE (DELTASONE) 50 MG tablet Take one 50mg  tablet once a day for five days. 03/23/17   Orvil FeilWoods, Jaclyn M, PA-C  sildenafil (VIAGRA) 100 MG tablet Take 0.5 mg by mouth daily as needed. 11/30/14   [provider]    Family History Family History  Problem Relation Age of Onset  . Cancer Mother   . Diabetes  Sister   . Hypertension Sister   . Hypertension Brother     Social History Social History   Tobacco Use  . Smoking status: Current Every Day Smoker    Packs/day: 1.00    Years: 25.00    Pack years: 25.00    Types: Cigarettes  . Smokeless tobacco: Never Used  Substance Use Topics  . Alcohol use: No    Alcohol/week: 0.0 standard drinks  . Drug use: No     Allergies   Penicillins   Review of Systems Review of Systems  Constitutional: Negative for fever.  Musculoskeletal: Positive for arthralgias and myalgias. Negative for joint swelling and neck pain.  Skin: Negative for rash and  wound.  Neurological: Negative for numbness.     Physical Exam Updated Vital Signs BP 139/79   Pulse 83   Temp 97.9 F (36.6 C) (Oral)   Resp 18   Ht  (1.676 m)   Wt 136.1 kg   SpO2 94%   BMI 48.42 kg/m   Physical Exam Constitutional:      Appearance: He is well-developed.  HENT:     Head: Normocephalic and atraumatic.  Eyes:     Conjunctiva/sclera: Conjunctivae normal.  Neck:     Musculoskeletal: Normal range of motion.  Cardiovascular:     Rate and Rhythm: Normal rate.  Pulmonary:     Effort: Pulmonary effort is normal. No respiratory distress.  Musculoskeletal:     Comments: Positive Finkelstein's test the right wrist with no swelling warmth erythema or skin rash.  He has full composite fist.  Negative CMC grind test.  No catching triggering locking of the digits.  Good range of motion the elbow.  Neurovascular intact in right upper extremity.  Skin:    General: Skin is warm.     Findings: No rash.  Neurological:     Mental Status: He is alert and oriented to person, place, and time.  Psychiatric:        Behavior: Behavior normal.        Thought Content: Thought content normal.      ED Treatments / Results  Labs (all labs ordered are listed, but only abnormal results are displayed) Labs Reviewed - No data to display  EKG None  Radiology No results found.  X-rays of the right wrist ordered and reviewed by me today show no evidence of acute bony abnormality.  No significant arthropathy.  No abnormal bony lesions.  No significant soft tissue swelling.  Procedures Procedures (including critical care time)  Medications Ordered in ED Medications - No data to display   Initial Impression / Assessment and Plan / ED Course  I have reviewed the triage vital signs and the nursing notes.  Pertinent labs & imaging results that were available during my care of the patient were reviewed by me and considered in my medical decision making (see chart for  details).        48 year old male with de Quervain's tendinitis of the right wrist.  X-rays show no evidence of acute bony abnormality or arthropathy.  He is placed into a thumb spica splint today in the emergency department and started on Mobic 7.5 mg daily for 10 days.  He will continue with Tylenol.  He also apply ice.  He will follow-up with PCP orthopedics if no improvement 2 weeks. Final Clinical Impressions(s) / ED Diagnoses   Final diagnoses:  Tendinitis, de Quervain's    ED Discharge Orders  Ordered    meloxicam (MOBIC) 7.5 MG tablet  Daily     08/27/18 2228           Ronnette Juniper 08/27/18 2231    Dionne Bucy, MD 08/27/18 2312

## 2018-08-27 NOTE — Discharge Instructions (Addendum)
Please take Tylenol as needed for pain.  You may take Mobic daily x10 days with food.  Wear thumb spica splint at all times except for showering until pain resolves.  If continued pain in 2 weeks recommend follow-up with orthopedist or primary care provider

## 2018-08-27 NOTE — ED Notes (Signed)
Radiology to bedside. 

## 2018-08-27 NOTE — ED Triage Notes (Signed)
Pt has right wrist pain.   No known injury to wrist.   No deformity noted.  Pt alert.

## 2018-10-20 ENCOUNTER — Encounter: Payer: Self-pay | Admitting: Emergency Medicine

## 2018-10-20 ENCOUNTER — Other Ambulatory Visit: Payer: Self-pay

## 2018-10-20 ENCOUNTER — Emergency Department
Admission: EM | Admit: 2018-10-20 | Discharge: 2018-10-20 | Disposition: A | Discharge status: Home / Self Care | Payer: BLUE CROSS/BLUE SHIELD | Attending: Emergency Medicine | Admitting: Emergency Medicine

## 2018-10-20 DIAGNOSIS — I10 Essential (primary) hypertension: Secondary | ICD-10-CM | POA: Diagnosis not present

## 2018-10-20 DIAGNOSIS — M67833 Other specified disorders of tendon, right wrist: Secondary | ICD-10-CM | POA: Diagnosis not present

## 2018-10-20 DIAGNOSIS — F17210 Nicotine dependence, cigarettes, uncomplicated: Secondary | ICD-10-CM | POA: Insufficient documentation

## 2018-10-20 DIAGNOSIS — J449 Chronic obstructive pulmonary disease, unspecified: Secondary | ICD-10-CM | POA: Insufficient documentation

## 2018-10-20 DIAGNOSIS — Z79899 Other long term (current) drug therapy: Secondary | ICD-10-CM | POA: Diagnosis not present

## 2018-10-20 DIAGNOSIS — E119 Type 2 diabetes mellitus without complications: Secondary | ICD-10-CM | POA: Insufficient documentation

## 2018-10-20 DIAGNOSIS — M25531 Pain in right wrist: Secondary | ICD-10-CM | POA: Diagnosis present

## 2018-10-20 DIAGNOSIS — M778 Other enthesopathies, not elsewhere classified: Secondary | ICD-10-CM

## 2018-10-20 MED ORDER — PREDNISONE 10 MG PO TABS: ORAL_TABLET | ORAL | 0 refills | Status: DC

## 2018-10-20 NOTE — ED Notes (Signed)
See triage note.  Presents with pain to right wrist  Hx of tendonitis and is currently taking Mobic    Pain became worse over the past couple of days

## 2018-10-20 NOTE — ED Triage Notes (Signed)
Patient ambulatory to triage with steady gait, without difficulty or distress noted; pt reports that he has tendonitis in right wrist unrelieved by meloxicam

## 2018-10-20 NOTE — ED Provider Notes (Signed)
[facility] Emergency Department Provider Note   ____________________________________________   None    (approximate)  I have reviewed the triage vital signs and the nursing notes.   HISTORY  Chief Complaint Wrist Pain   HPI Harold Owens is a 48 y.o. male resents to the ED with complaint of right wrist pain for several weeks.  He denies any history of injury.  He states in the past he has had problems with tendinitis for greater than 1 year intermittently.  He had planned on going to the orthopedist but has not made an appointment thus far.  He also was taking meloxicam without any relief but ran out 3 weeks ago and currently taking Tylenol only.  Currently rates pain as a 10/10.     Past Medical History:  Diagnosis Date  . COPD (chronic obstructive pulmonary disease) (HCC)   . Diabetes mellitus without complication (HCC)   . Hypertension     There are no active problems to display for this patient.   Past Surgical History:  Procedure Laterality Date  . TONSILLECTOMY    . WRIST SURGERY      Prior to Admission medications   Medication Sig Start Date End Date Taking? Authorizing Provider  albuterol (PROVENTIL) (2.5 MG/3ML) 0.083% nebulizer solution Take 3 mLs (2.5 mg total) by nebulization every 6 (six) hours as needed for wheezing or shortness of breath. 04/29/18   Loleta RoseForbach, Cory, MD  albuterol (VENTOLIN HFA) 108 (90 Base) MCG/ACT inhaler Inhale 2 puffs into the lungs every 6 (six) hours as needed. 07/11/15 07/10/16  [provider]  amLODipine (NORVASC) 5 MG tablet Take 1 tablet (5 mg total) by mouth daily. 04/29/18 04/29/19  Loleta RoseForbach, Cory, MD  atorvastatin (LIPITOR) 20 MG tablet Take 1 tablet (20 mg total) by mouth daily. 04/29/18 04/29/19  Loleta RoseForbach, Cory, MD  econazole nitrate 1 % cream Apply 1 application topically 2 (two) times daily. 07/13/15   [provider]  hydrochlorothiazide (MICROZIDE) 12.5 MG capsule Take 1  capsule (12.5 mg total) by mouth daily. 04/29/18 04/29/19  Loleta RoseForbach, Cory, MD  predniSONE (DELTASONE) 10 MG tablet Take 6 tablets  today, on day 2 take 5 tablets, day 3 take 4 tablets, day 4 take 3 tablets, day 5 take  2 tablets and 1 tablet the last day 10/20/18   Tommi RumpsSummers, Rhonda L, PA-C  sildenafil (VIAGRA) 100 MG tablet Take 0.5 mg by mouth daily as needed. 11/30/14   [provider]    Allergies Penicillins  Family History  Problem Relation Age of Onset  . Cancer Mother   . Diabetes Sister   . Hypertension Sister   . Hypertension Brother     Social History Social History   Tobacco Use  . Smoking status: Current Every Day Smoker    Packs/day: 1.00    Years: 25.00    Pack years: 25.00    Types: Cigarettes  . Smokeless tobacco: Never Used  Substance Use Topics  . Alcohol use: No    Alcohol/week: 0.0 standard drinks  . Drug use: No    Review of Systems Constitutional: No fever/chills Cardiovascular: Denies chest pain. Respiratory: Denies shortness of breath. Musculoskeletal: Positive for right wrist pain. Skin: Negative for rash. Neurological: Negative for headaches, focal weakness or numbness. ___________________________________________   PHYSICAL EXAM:  VITAL SIGNS: ED Triage Vitals  Enc Vitals Group     BP 10/20/18 0642 137/70     Pulse Rate 10/20/18 0642 84     Resp 10/20/18  1624 18     Temp 10/20/18 0642 97.7 F (36.5 C)     Temp Source 10/20/18 0642 Oral     SpO2 10/20/18 0642 95 %     Weight 10/20/18 0640 300 lb (136.1 kg)     Height 10/20/18 0640 5\' 6"  (1.676 m)     Head Circumference --      Peak Flow --      Pain Score 10/20/18 0640 10     Pain Loc --      Pain Edu? --      Excl. in GC? --    Constitutional: Alert and oriented. Well appearing and in no acute distress. Eyes: Conjunctivae are normal.  Head: Atraumatic. Neck: No stridor.   Cardiovascular: Normal rate, regular rhythm. Grossly normal heart sounds.  Good peripheral  circulation. Respiratory: Normal respiratory effort.  No retractions. Lungs CTAB. Musculoskeletal: Right wrist examination is negative for any gross deformity or soft tissue edema.  There is moderate tenderness on palpation of the radial aspect.  Patient has discomfort with flexion and extension.  There is no difficulty with moving his thumb.  Motor sensory function intact.  Capillary refills less than 3 seconds.  No warmth or discoloration noted.  Good muscle strength. Neurologic:  Normal speech and language. No gross focal neurologic deficits are appreciated. Skin:  Skin is warm, dry and intact. No rash noted. Psychiatric: Mood and affect are normal. Speech and behavior are normal.  ____________________________________________   LABS (all labs ordered are listed, but only abnormal results are displayed)  Labs Reviewed - No data to display  PROCEDURES  Procedure(s) performed (including Critical Care):  Procedures Patient was placed in a cock-up wrist splint by RN  ____________________________________________   INITIAL IMPRESSION / ASSESSMENT AND PLAN / ED COURSE  As part of my medical decision making, I reviewed the following data within the electronic MEDICAL RECORD NUMBER Notes from prior ED visits and [place] Controlled Substance Database     48 year old male presents to the ED with complaint of intermittent right wrist pain for 1 year.  His most recent medication was meloxicam which he ran out of but states that this was not helping.  Physical exam is benign with the exception of some discomfort with flexion and extension.  Patient was placed in a cock-up wrist splint and started on a tapering dose of prednisone.  He is encouraged to use ice as needed for discomfort.  He plans to see an orthopedist and contact information for Dr. Joice Lofts was given to him.  ____________________________________________   FINAL CLINICAL IMPRESSION(S) / ED DIAGNOSES  Final diagnoses:  Tendinitis of right  wrist     ED Discharge Orders         Ordered    predniSONE (DELTASONE) 10 MG tablet     10/20/18 4695           Note:  This document was prepared using Dragon voice recognition software and may include unintentional dictation errors.    Tommi Rumps, PA-C 10/20/18 0722    Jene Every, MD 10/20/18 1021

## 2018-10-20 NOTE — Discharge Instructions (Signed)
Call make an appointment with Dr. Joice Lofts who is on-call for orthopedics if you continue to have problems.  Wear the cock-up wrist splint provided for you today especially at night.  Begin taking prednisone tapering dose.  The emergency department does not put refills on your medication you will need to see her primary care provider if more medication is needed prior to your orthopedic appointment.  You may also take Tylenol with this medication if needed for added pain relief.  You may also try ice to your wrist to see if this helps with your pain.

## 2018-11-18 ENCOUNTER — Encounter: Payer: Self-pay | Admitting: Emergency Medicine

## 2018-11-18 ENCOUNTER — Emergency Department
Admission: EM | Admit: 2018-11-18 | Discharge: 2018-11-18 | Disposition: A | Discharge status: Home / Self Care | Payer: BLUE CROSS/BLUE SHIELD | Attending: Emergency Medicine | Admitting: Emergency Medicine

## 2018-11-18 ENCOUNTER — Other Ambulatory Visit: Payer: Self-pay

## 2018-11-18 DIAGNOSIS — Y939 Activity, unspecified: Secondary | ICD-10-CM | POA: Diagnosis not present

## 2018-11-18 DIAGNOSIS — E119 Type 2 diabetes mellitus without complications: Secondary | ICD-10-CM | POA: Diagnosis not present

## 2018-11-18 DIAGNOSIS — J449 Chronic obstructive pulmonary disease, unspecified: Secondary | ICD-10-CM | POA: Diagnosis not present

## 2018-11-18 DIAGNOSIS — I10 Essential (primary) hypertension: Secondary | ICD-10-CM | POA: Insufficient documentation

## 2018-11-18 DIAGNOSIS — F17210 Nicotine dependence, cigarettes, uncomplicated: Secondary | ICD-10-CM | POA: Insufficient documentation

## 2018-11-18 DIAGNOSIS — M25531 Pain in right wrist: Secondary | ICD-10-CM | POA: Diagnosis not present

## 2018-11-18 DIAGNOSIS — M779 Enthesopathy, unspecified: Secondary | ICD-10-CM

## 2018-11-18 DIAGNOSIS — Z79899 Other long term (current) drug therapy: Secondary | ICD-10-CM | POA: Insufficient documentation

## 2018-11-18 DIAGNOSIS — M70841 Other soft tissue disorders related to use, overuse and pressure, right hand: Secondary | ICD-10-CM | POA: Insufficient documentation

## 2018-11-18 MED ORDER — KETOROLAC TROMETHAMINE 30 MG/ML IJ SOLN
30.0000 mg | Freq: Once | INTRAMUSCULAR | Status: AC
  Administered 2018-11-18: 30 mg via INTRAMUSCULAR
  Filled 2018-11-18: qty 1

## 2018-11-18 MED ORDER — MELOXICAM 15 MG PO TABS: 15.0000 mg | ORAL_TABLET | Freq: Every day | ORAL | 0 refills | Status: DC | PRN

## 2018-11-18 NOTE — ED Triage Notes (Signed)
Pt presents to ED via POV with c/o R wrist pain x 2 months.

## 2018-11-18 NOTE — ED Notes (Signed)
See triage note  States he is having pain and some swelling to right wrist area  Hx of tendonitis   Also states he jammed his thumb couple of times which triggered the pain  Good pulses

## 2018-11-18 NOTE — ED Provider Notes (Addendum)
[facility] Emergency Department Provider Note  ____________________________________________  Time seen: Approximately 8:32 AM  I have reviewed the triage vital signs and the nursing notes.   HISTORY  Chief Complaint Wrist Pain    HPI Harold Owens is a 48 y.o. male that presents to the emergency department for evaluation of right wrist/thumb pain for 2 months.  Patient states that he knows he needs to see a specialist but his insurance will not kick in for 3 more months.  He was evaluated for this in the emergency department 1 month ago.  He was given steroids, which helped temporarily.  He was also hoping to get a splint that would cover his thumb.  No new injury.  No numbness, tingling.   Past Medical History:  Diagnosis Date  . COPD (chronic obstructive pulmonary disease) ([place])   . Diabetes mellitus without complication ([place])   . Hypertension     There are no active problems to display for this patient.   Past Surgical History:  Procedure Laterality Date  . TONSILLECTOMY    . WRIST SURGERY      Prior to Admission medications   Medication Sig Start Date End Date Taking? Authorizing Provider  albuterol (PROVENTIL) (2.5 MG/3ML) 0.083% nebulizer solution Take 3 mLs (2.5 mg total) by nebulization every 6 (six) hours as needed for wheezing or shortness of breath. 04/29/18   Hinda Kehr, MD  albuterol (VENTOLIN HFA) 108 (90 Base) MCG/ACT inhaler Inhale 2 puffs into the lungs every 6 (six) hours as needed. 07/11/15 07/10/16  [provider]  amLODipine (NORVASC) 5 MG tablet Take 1 tablet (5 mg total) by mouth daily. 04/29/18 04/29/19  Hinda Kehr, MD  atorvastatin (LIPITOR) 20 MG tablet Take 1 tablet (20 mg total) by mouth daily. 04/29/18 04/29/19  Hinda Kehr, MD  econazole nitrate 1 % cream Apply 1 application topically 2 (two) times daily. 07/13/15   [provider]  hydrochlorothiazide (MICROZIDE) 12.5 MG capsule Take 1  capsule (12.5 mg total) by mouth daily. 04/29/18 04/29/19  Hinda Kehr, MD  meloxicam (MOBIC) 15 MG tablet Take 1 tablet (15 mg total) by mouth daily as needed for pain. 11/18/18   Laban Emperor, PA-C  sildenafil (VIAGRA) 100 MG tablet Take 0.5 mg by mouth daily as needed. 11/30/14   [provider]    Allergies Penicillins  Family History  Problem Relation Age of Onset  . Cancer Mother   . Diabetes Sister   . Hypertension Sister   . Hypertension Brother     Social History Social History   Tobacco Use  . Smoking status: Current Every Day Smoker    Packs/day: 1.00    Years: 25.00    Pack years: 25.00    Types: Cigarettes  . Smokeless tobacco: Never Used  Substance Use Topics  . Alcohol use: No    Alcohol/week: 0.0 standard drinks  . Drug use: No     Review of Systems  Gastrointestinal:  No nausea, no vomiting.  Musculoskeletal: Positive for thumb/wrist pain. Skin: Negative for rash, abrasions, lacerations, ecchymosis. Neurological: Negative for numbness or tingling   ____________________________________________   PHYSICAL EXAM:  VITAL SIGNS: ED Triage Vitals  Enc Vitals Group     BP 11/18/18 0823 116/69     Pulse Rate 11/18/18 0823 97     Resp 11/18/18 0823 20     Temp 11/18/18 0823 98.4 F (36.9 C)     Temp Source 11/18/18 0823 Oral     SpO2  11/18/18 0823 97 %     Weight 11/18/18 0822 300 lb (136.1 kg)     Height 11/18/18 0822 5\' 6"  (1.676 m)     Head Circumference --      Peak Flow --      Pain Score 11/18/18 0821 10     Pain Loc --      Pain Edu? --      Excl. in GC? --      Constitutional: Alert and oriented. Well appearing and in no acute distress. Eyes: Conjunctivae are normal. PERRL. EOMI. Head: Atraumatic. ENT:      Ears:      Nose: No congestion/rhinnorhea.      Mouth/Throat: Mucous membranes are moist.  Neck: No stridor. Cardiovascular: Normal rate, regular rhythm.  Good peripheral circulation. Respiratory: Normal  respiratory effort without tachypnea or retractions. Lungs CTAB. Good air entry to the bases with no decreased or absent breath sounds. Musculoskeletal: Full range of motion to all extremities. No gross deformities appreciated.  Tenderness to palpation over radial wrist at the base of the thumb.  Full range of motion of right wrist.  Positive Finkelstein's test. No swelling or erythema. No wounds. Neurologic:  Normal speech and language. No gross focal neurologic deficits are appreciated.  Skin:  Skin is warm, dry and intact. No rash noted. Psychiatric: Mood and affect are normal. Speech and behavior are normal. Patient exhibits appropriate insight and judgement.   ____________________________________________   LABS (all labs ordered are listed, but only abnormal results are displayed)  Labs Reviewed - No data to display ____________________________________________  EKG   ____________________________________________  RADIOLOGY   No results found.  ____________________________________________    PROCEDURES  Procedure(s) performed:    Procedures    Medications  ketorolac (TORADOL) 30 MG/ML injection 30 mg (30 mg Intramuscular Given 11/18/18 0851)     ____________________________________________   INITIAL IMPRESSION / ASSESSMENT AND PLAN / ED COURSE  Pertinent labs & imaging results that were available during my care of the patient were reviewed by me and considered in my medical decision making (see chart for details).  Review of the [place] CSRS was performed in accordance of the [facility] prior to dispensing any controlled drugs.     Patient's diagnosis is consistent with tendinitis.  Vital signs and exam are reassuring.  Patient was given a shot of Toradol in the emergency department.  Wrist splint was given.  Patient will be discharged home with prescriptions for Mobic. Patient is to follow up with Ortho and primary care as directed. Patient is given ED precautions to  return to the ED for any worsening or new symptoms.   Harold Owens was evaluated in Emergency Department on 11/18/2018 for the symptoms described in the history of present illness. He was evaluated in the context of the global COVID-19 pandemic, which necessitated consideration that the patient might be at risk for infection with the SARS-CoV-2 virus that causes COVID-19. Institutional protocols and algorithms that pertain to the evaluation of patients at risk for COVID-19 are in a state of rapid change based on information released by regulatory bodies including the CDC and federal and state organizations. These policies and algorithms were followed during the patient's care in the ED.  ____________________________________________  FINAL CLINICAL IMPRESSION(S) / ED DIAGNOSES  Final diagnoses:  Right wrist pain  Tendinitis      NEW MEDICATIONS STARTED DURING THIS VISIT:  ED Discharge Orders         Ordered  meloxicam (MOBIC) 15 MG tablet  Daily PRN     11/18/18 0847              This chart was dictated using voice recognition software/Dragon. Despite best efforts to proofread, errors can occur which can change the meaning. Any change was purely unintentional.    Enid DerryWagner, Zannah Melucci, PA-C 11/18/18 1607    Enid DerryWagner, Rosio Weiss, PA-C 11/18/18 1607    Shaune PollackIsaacs, Cameron, MD 11/18/18 2000

## 2018-11-24 ENCOUNTER — Encounter: Payer: Self-pay | Admitting: Emergency Medicine

## 2018-11-24 ENCOUNTER — Emergency Department: Payer: BLUE CROSS/BLUE SHIELD

## 2018-11-24 ENCOUNTER — Other Ambulatory Visit: Payer: Self-pay

## 2018-11-24 ENCOUNTER — Emergency Department
Admission: EM | Admit: 2018-11-24 | Discharge: 2018-11-24 | Disposition: A | Discharge status: Home / Self Care | Payer: BLUE CROSS/BLUE SHIELD | Attending: Emergency Medicine | Admitting: Emergency Medicine

## 2018-11-24 DIAGNOSIS — I10 Essential (primary) hypertension: Secondary | ICD-10-CM | POA: Insufficient documentation

## 2018-11-24 DIAGNOSIS — E119 Type 2 diabetes mellitus without complications: Secondary | ICD-10-CM | POA: Diagnosis not present

## 2018-11-24 DIAGNOSIS — M25561 Pain in right knee: Secondary | ICD-10-CM

## 2018-11-24 DIAGNOSIS — F17210 Nicotine dependence, cigarettes, uncomplicated: Secondary | ICD-10-CM | POA: Insufficient documentation

## 2018-11-24 DIAGNOSIS — M1711 Unilateral primary osteoarthritis, right knee: Secondary | ICD-10-CM | POA: Diagnosis not present

## 2018-11-24 DIAGNOSIS — J449 Chronic obstructive pulmonary disease, unspecified: Secondary | ICD-10-CM | POA: Diagnosis not present

## 2018-11-24 DIAGNOSIS — Z79899 Other long term (current) drug therapy: Secondary | ICD-10-CM | POA: Diagnosis not present

## 2018-11-24 MED ORDER — DICLOFENAC SODIUM 50 MG PO TBEC: 50.0000 mg | DELAYED_RELEASE_TABLET | Freq: Two times a day (BID) | ORAL | 1 refills | Status: AC

## 2018-11-24 NOTE — ED Notes (Signed)
Right knee pain. Denies recent injury.

## 2018-11-24 NOTE — ED Triage Notes (Signed)
C/O right knee pain x 4 days. Pain worse today. States pain worse with side to side movements at work.

## 2018-11-24 NOTE — Discharge Instructions (Addendum)
Follow-up with ortho for ongoing symptoms. Wear the knee immobilizer while walking and while at work. Take the prescription anti-inflammatory as directed.

## 2018-11-24 NOTE — ED Provider Notes (Signed)
[facility] Emergency Department Provider Note ____________________________________________  Time seen: [id number]  I have reviewed the triage vital signs and the nursing notes.  HISTORY  Chief Complaint  Knee Pain  HPI Christen BameRonnie Celine MansMancini Jeane is a 48 y.o. male presents himself to the ED for evaluation of acute on chronic right knee pain.  Patient gives a history of probable meniscus injury to the right knee, and has never been evaluated or repaired by orthopedics.  He reports activities at work which include prolonged standing and pivoting, have caused increased pain to the knee.  He describes intermittent catching and giving way to the knee with ambulation.  He denies any outright fall or contusion.  He is currently taking meloxicam for a pre-existing tendinitis in the wrist.  He has in the process of seeing orthopedics for that injury at this time.  He denies any other injury, leg swelling, chest pain, or shortness of breath.  Past Medical History:  Diagnosis Date  . COPD (chronic obstructive pulmonary disease) (HCC)   . Diabetes mellitus without complication (HCC)   . Hypertension     There are no active problems to display for this patient.   Past Surgical History:  Procedure Laterality Date  . TONSILLECTOMY    . WRIST SURGERY      Prior to Admission medications   Medication Sig Start Date End Date Taking? Authorizing Provider  albuterol (PROVENTIL) (2.5 MG/3ML) 0.083% nebulizer solution Take 3 mLs (2.5 mg total) by nebulization every 6 (six) hours as needed for wheezing or shortness of breath. 04/29/18   Loleta RoseForbach, Cory, MD  albuterol (VENTOLIN HFA) 108 (90 Base) MCG/ACT inhaler Inhale 2 puffs into the lungs every 6 (six) hours as needed. 07/11/15 07/10/16  [provider]  amLODipine (NORVASC) 5 MG tablet Take 1 tablet (5 mg total) by mouth daily. 04/29/18 04/29/19  Loleta RoseForbach, Cory, MD  atorvastatin (LIPITOR) 20 MG tablet Take 1 tablet (20 mg total) by mouth  daily. 04/29/18 04/29/19  Loleta RoseForbach, Cory, MD  diclofenac (VOLTAREN) 50 MG EC tablet Take 1 tablet (50 mg total) by mouth 2 (two) times daily. 11/24/18 01/23/19  [place] Bohac, Charlesetta IvoryJenise V Bacon, PA-C  econazole nitrate 1 % cream Apply 1 application topically 2 (two) times daily. 07/13/15   [provider]  hydrochlorothiazide (MICROZIDE) 12.5 MG capsule Take 1 capsule (12.5 mg total) by mouth daily. 04/29/18 04/29/19  Loleta RoseForbach, Cory, MD  meloxicam (MOBIC) 15 MG tablet Take 1 tablet (15 mg total) by mouth daily as needed for pain. 11/18/18   [place] DerryWagner, Ashley, PA-C  sildenafil (VIAGRA) 100 MG tablet Take 0.5 mg by mouth daily as needed. 11/30/14   [provider]    Allergies Penicillins  Family History  Problem Relation Age of Onset  . Cancer Mother   . Diabetes Sister   . Hypertension Sister   . Hypertension Brother     Social History Social History   Tobacco Use  . Smoking status: Current Every Day Smoker    Packs/day: 1.00    Years: 25.00    Pack years: 25.00    Types: Cigarettes  . Smokeless tobacco: Never Used  Substance Use Topics  . Alcohol use: No    Alcohol/week: 0.0 standard drinks  . Drug use: No    Review of Systems  Constitutional: Negative for fever. Cardiovascular: Negative for chest pain. Respiratory: Negative for shortness of breath. Musculoskeletal: Negative for back pain.  Right knee pain as above. Skin: Negative for rash. Neurological: Negative for headaches, focal  weakness or numbness. ____________________________________________  PHYSICAL EXAM:  VITAL SIGNS: ED Triage Vitals  Enc Vitals Group     BP 11/24/18 1836 (!) 142/77     Pulse Rate 11/24/18 1836 88     Resp 11/24/18 1836 16     Temp 11/24/18 1836 98.4 F (36.9 C)     Temp Source 11/24/18 1836 Oral     SpO2 11/24/18 1836 98 %     Weight 11/24/18 1833 (!) 300 lb 0.7 oz (136.1 kg)     Height --      Head Circumference --      Peak Flow --      Pain Score 11/24/18 1833 9     Pain Loc  --      Pain Edu? --      Excl. in [place]? --     Constitutional: Alert and oriented. Well appearing and in no distress. Head: Normocephalic and atraumatic. Eyes: Conjunctivae are normal. Normal extraocular movements Cardiovascular: Normal rate, regular rhythm. Normal distal pulses. Respiratory: Normal respiratory effort. No wheezes/rales/rhonchi. Musculoskeletal: Right knee without any obvious deformity, dislocation, or effusion.  Patient is able demonstrate normal flexion extension range in the supine position.  Patient is mildly tender to palpation to the medial joint line but no popliteal space fullness is noted.  No calf or Achilles tenderness is elicited distally.  No significant valgus or varus strain stress is elicited on exam.  Nontender with normal range of motion in all extremities.  Neurologic:  Normal sensation.  Normal speech and language. No gross focal neurologic deficits are appreciated. Skin:  Skin is warm, dry and intact. No rash noted. Psychiatric: Mood and affect are normal. Patient exhibits appropriate insight and judgment. ____________________________________________   RADIOLOGY  Right knee  Negative  I, Shadai Mcclane V Bacon-Kerly Rigsbee, personally viewed and evaluated these images (plain radiographs) as part of my medical decision making, as well as reviewing the written report by the radiologist. ____________________________________________  PROCEDURES  Procedures Knee immobilizer ____________________________________________  INITIAL IMPRESSION / ASSESSMENT AND PLAN / ED COURSE  Graham Franck Vinal was evaluated in Emergency Department on 11/24/2018 for the symptoms described in the history of present illness. He was evaluated in the context of the global COVID-19 pandemic, which necessitated consideration that the patient might be at risk for infection with the SARS-CoV-2 virus that causes COVID-19. Institutional protocols and algorithms that pertain to the evaluation of  patients at risk for COVID-19 are in a state of rapid change based on information released by regulatory bodies including the CDC and federal and state organizations. These policies and algorithms were followed during the patient's care in the ED.  Patient with ED evaluation of acute on chronic right knee pain.  His x-ray is overall benign without any acute fracture or dislocation.  He appears to have some very mild early degeneration of the medial meniscus.  Otherwise patient symptoms likely represent an acute knee sprain.  He will be placed in a knee immobilizer for support.  He is referred to orthopedics for evaluation of his knee pain.  A prescription for diclofenac is provided for anti-inflammatory benefit.  He will return to work without restrictions at this time. ____________________________________________  FINAL CLINICAL IMPRESSION(S) / ED DIAGNOSES  Final diagnoses:  Acute pain of right knee  Primary osteoarthritis of right knee      Carmie End, Dannielle Karvonen, PA-C 11/24/18 2335    Nena Polio, MD 11/24/18 [phone]

## 2018-11-24 NOTE — ED Notes (Signed)
Knee immobilizer applied to the right knee. Pt states registration has not spoken with him yet. Charge called.

## 2019-02-21 ENCOUNTER — Emergency Department
Admission: EM | Admit: 2019-02-21 | Discharge: 2019-02-21 | Disposition: A | Discharge status: Home / Self Care | Payer: BLUE CROSS/BLUE SHIELD | Attending: Emergency Medicine | Admitting: Emergency Medicine

## 2019-02-21 ENCOUNTER — Other Ambulatory Visit: Payer: Self-pay

## 2019-02-21 DIAGNOSIS — F17210 Nicotine dependence, cigarettes, uncomplicated: Secondary | ICD-10-CM | POA: Diagnosis not present

## 2019-02-21 DIAGNOSIS — E119 Type 2 diabetes mellitus without complications: Secondary | ICD-10-CM | POA: Insufficient documentation

## 2019-02-21 DIAGNOSIS — Z79899 Other long term (current) drug therapy: Secondary | ICD-10-CM | POA: Insufficient documentation

## 2019-02-21 DIAGNOSIS — I10 Essential (primary) hypertension: Secondary | ICD-10-CM | POA: Insufficient documentation

## 2019-02-21 DIAGNOSIS — G8929 Other chronic pain: Secondary | ICD-10-CM | POA: Diagnosis not present

## 2019-02-21 DIAGNOSIS — Z760 Encounter for issue of repeat prescription: Secondary | ICD-10-CM | POA: Insufficient documentation

## 2019-02-21 DIAGNOSIS — M25561 Pain in right knee: Secondary | ICD-10-CM | POA: Diagnosis not present

## 2019-02-21 DIAGNOSIS — M778 Other enthesopathies, not elsewhere classified: Secondary | ICD-10-CM

## 2019-02-21 DIAGNOSIS — M25531 Pain in right wrist: Secondary | ICD-10-CM | POA: Diagnosis present

## 2019-02-21 DIAGNOSIS — J449 Chronic obstructive pulmonary disease, unspecified: Secondary | ICD-10-CM | POA: Insufficient documentation

## 2019-02-21 MED ORDER — CYCLOBENZAPRINE HCL 5 MG PO TABS: 5.0000 mg | ORAL_TABLET | Freq: Every evening | ORAL | 0 refills | Status: DC | PRN

## 2019-02-21 MED ORDER — DICLOFENAC SODIUM 50 MG PO TBEC: 50.0000 mg | DELAYED_RELEASE_TABLET | Freq: Two times a day (BID) | ORAL | 2 refills | Status: AC

## 2019-02-21 MED ORDER — LIDOCAINE 5 % EX PTCH: 1.0000 | MEDICATED_PATCH | CUTANEOUS | 0 refills | Status: AC

## 2019-02-21 MED ORDER — LIDOCAINE 5 % EX PTCH
1.0000 | MEDICATED_PATCH | CUTANEOUS | Status: DC
  Administered 2019-02-21: 1 via TRANSDERMAL
  Filled 2019-02-21: qty 1

## 2019-02-21 NOTE — ED Triage Notes (Signed)
Pt c/o tendinitis pain in the right wrist and knee pain for the past several months, worse in the past week.

## 2019-02-21 NOTE — ED Provider Notes (Signed)
[facility] Emergency Department Provider Note ____________________________________________  Time seen: 1053  I have reviewed the triage vital signs and the nursing notes.  HISTORY  Chief Complaint  Wrist Pain and Knee Pain  HPI Harold Owens is a 48 y.o. male presents himself to the ED for evaluation of ongoing right wrist and right knee pain.  Patient with a history of thumb tendinitis  as well as chronic knee pain, presents for management of his symptoms.  He denies any preceding injury, accident, or trauma.  He reports that the previously prescribed diclofenac has been working well for his knee pain.  He is requesting a refill of his meds at this time.  Past Medical History:  Diagnosis Date  . COPD (chronic obstructive pulmonary disease) (HCC)   . Diabetes mellitus without complication (HCC)   . Hypertension     There are no active problems to display for this patient.   Past Surgical History:  Procedure Laterality Date  . TONSILLECTOMY    . WRIST SURGERY      Prior to Admission medications   Medication Sig Start Date End Date Taking? Authorizing Provider  albuterol (PROVENTIL) (2.5 MG/3ML) 0.083% nebulizer solution Take 3 mLs (2.5 mg total) by nebulization every 6 (six) hours as needed for wheezing or shortness of breath. 04/29/18   Loleta Rose, MD  albuterol (VENTOLIN HFA) 108 (90 Base) MCG/ACT inhaler Inhale 2 puffs into the lungs every 6 (six) hours as needed. 07/11/15 07/10/16  [provider]  amLODipine (NORVASC) 5 MG tablet Take 1 tablet (5 mg total) by mouth daily. 04/29/18 04/29/19  Loleta Rose, MD  atorvastatin (LIPITOR) 20 MG tablet Take 1 tablet (20 mg total) by mouth daily. 04/29/18 04/29/19  Loleta Rose, MD  cyclobenzaprine (FLEXERIL) 5 MG tablet Take 1 tablet (5 mg total) by mouth at bedtime as needed for muscle spasms. 02/21/19   [place] Flett, Charlesetta Ivory, PA-C  diclofenac (VOLTAREN) 50 MG EC tablet Take 1 tablet (50 mg  total) by mouth 2 (two) times daily. 02/21/19 05/22/19  Alessa Mazur, Charlesetta Ivory, PA-C  econazole nitrate 1 % cream Apply 1 application topically 2 (two) times daily. 07/13/15   [provider]  hydrochlorothiazide (MICROZIDE) 12.5 MG capsule Take 1 capsule (12.5 mg total) by mouth daily. 04/29/18 04/29/19  Loleta Rose, MD  lidocaine (LIDODERM) 5 % Place 1 patch onto the skin daily for 5 days. Remove & Discard patch after 12 hours of wear each day. 02/21/19 02/26/19  Arieon Scalzo, Charlesetta Ivory, PA-C  sildenafil (VIAGRA) 100 MG tablet Take 0.5 mg by mouth daily as needed. 11/30/14   [provider]    Allergies Penicillins  Family History  Problem Relation Age of Onset  . Cancer Mother   . Diabetes Sister   . Hypertension Sister   . Hypertension Brother     Social History Social History   Tobacco Use  . Smoking status: Current Every Day Smoker    Packs/day: 1.00    Years: 25.00    Pack years: 25.00    Types: Cigarettes  . Smokeless tobacco: Never Used  Substance Use Topics  . Alcohol use: No    Alcohol/week: 0.0 standard drinks  . Drug use: No    Review of Systems  Constitutional: Negative for fever. Cardiovascular: Negative for chest pain. Respiratory: Negative for shortness of breath. Musculoskeletal: Negative for back pain.  Positive for right thumb and right knee pain as above. Skin: Negative for rash. Neurological: Negative for headaches,  focal weakness or numbness. ____________________________________________  PHYSICAL EXAM:  VITAL SIGNS: ED Triage Vitals  Enc Vitals Group     BP 02/21/19 1031 126/70     Pulse Rate 02/21/19 1031 83     Resp 02/21/19 1031 16     Temp 02/21/19 1031 97.8 F (36.6 C)     Temp Source 02/21/19 1031 Oral     SpO2 02/21/19 1031 97 %     Weight 02/21/19 1027 300 lb (136.1 kg)     Height 02/21/19 1027 5\' 6"  (1.676 m)     Head Circumference --      Peak Flow --      Pain Score 02/21/19 1027 10     Pain Loc --      Pain  Edu? --      Excl. in [place]? --     Constitutional: Alert and oriented. Well appearing and in no distress. Head: Normocephalic and atraumatic. Eyes: Conjunctivae are normal. Normal extraocular movements Cardiovascular: Normal rate, regular rhythm. Normal distal pulses. Respiratory: Normal respiratory effort. No wheezes/rales/rhonchi. Musculoskeletal: Right thumb without obvious deformity or dislocation.  There is tenderness palpation over the right extensor tendon of the thumb.  Positive Finkelstein on exam.  Patient with normal composite fist but decreased strength secondary to pain.  Right knee exam is normal without any signs of internal derangement. Nontender with normal range of motion in all extremities.  Neurologic:  Normal gross sensation.  Normal intrinsic testing.  Normal speech and language. No gross focal neurologic deficits are appreciated. Skin:  Skin is warm, dry and intact. No rash noted. ____________________________________________  PROCEDURES  Lidoderm patch Thumb spica splint Procedures ____________________________________________  INITIAL IMPRESSION / ASSESSMENT AND PLAN / ED COURSE  Patient with ED evaluation of persistent pain to the right thumb and right knee.  He is diagnosis of thumb tinnitus as well as knee pain is documented.  He is placed in a thumb spica splint after an ideal patch is applied to the extensor tendon of the right thumb.  He is also given refills of his diclofenac as well as Flexeril for intermittent muscle pain relief.  He will follow-up with Ortho for ongoing symptom management.  Harold Owens was evaluated in Emergency Department on 02/21/2019 for the symptoms described in the history of present illness. He was evaluated in the context of the global COVID-19 pandemic, which necessitated consideration that the patient might be at risk for infection with the SARS-CoV-2 virus that causes COVID-19. Institutional protocols and algorithms that  pertain to the evaluation of patients at risk for COVID-19 are in a state of rapid change based on information released by regulatory bodies including the CDC and federal and state organizations. These policies and algorithms were followed during the patient's care in the ED. ____________________________________________  FINAL CLINICAL IMPRESSION(S) / ED DIAGNOSES  Final diagnoses:  Chronic pain of right knee  Tendinitis of thumb      Carmie End, Dannielle Karvonen, PA-C 02/21/19 1136    Harvest Dark, MD 02/21/19 1500

## 2019-02-21 NOTE — ED Notes (Signed)
Pt c/o right wrist and right knee pain x1 week. Pt reports he has been taking tylenol with no relief.

## 2019-02-21 NOTE — Discharge Instructions (Addendum)
Take the prescription meds as directed. Wear the splint and patch as directed. Follow-up with Dr. Roland Rack or your provider for ongoing symptoms. Return as needed.

## 2019-07-29 ENCOUNTER — Telehealth: Payer: Self-pay | Admitting: Pharmacy Technician

## 2019-07-29 NOTE — Telephone Encounter (Signed)
Patient has [employer] with prescription coverage.  No longer meets [facility]'s eligibility criteria.  Patient notified.  Sherilyn Dacosta Care Manager [facility]

## 2019-12-01 ENCOUNTER — Other Ambulatory Visit: Payer: Self-pay

## 2019-12-01 ENCOUNTER — Emergency Department
Admission: EM | Admit: 2019-12-01 | Discharge: 2019-12-01 | Disposition: A | Discharge status: Home / Self Care | Payer: BC Managed Care – PPO | Attending: Emergency Medicine | Admitting: Emergency Medicine

## 2019-12-01 DIAGNOSIS — L0291 Cutaneous abscess, unspecified: Secondary | ICD-10-CM

## 2019-12-01 DIAGNOSIS — J441 Chronic obstructive pulmonary disease with (acute) exacerbation: Secondary | ICD-10-CM | POA: Diagnosis not present

## 2019-12-01 DIAGNOSIS — E119 Type 2 diabetes mellitus without complications: Secondary | ICD-10-CM | POA: Diagnosis not present

## 2019-12-01 DIAGNOSIS — L02211 Cutaneous abscess of abdominal wall: Secondary | ICD-10-CM | POA: Diagnosis not present

## 2019-12-01 DIAGNOSIS — I10 Essential (primary) hypertension: Secondary | ICD-10-CM | POA: Insufficient documentation

## 2019-12-01 DIAGNOSIS — Z79899 Other long term (current) drug therapy: Secondary | ICD-10-CM | POA: Insufficient documentation

## 2019-12-01 MED ORDER — SULFAMETHOXAZOLE-TRIMETHOPRIM 800-160 MG PO TABS: 1.0000 | ORAL_TABLET | Freq: Two times a day (BID) | ORAL | 0 refills | Status: DC

## 2019-12-01 MED ORDER — LIDOCAINE HCL (PF) 1 % IJ SOLN
5.0000 mL | Freq: Once | INTRAMUSCULAR | Status: AC
  Administered 2019-12-01: 5 mL via INTRADERMAL
  Filled 2019-12-01: qty 5

## 2019-12-01 MED ORDER — HYDROCODONE-ACETAMINOPHEN 5-325 MG PO TABS: 1.0000 | ORAL_TABLET | Freq: Four times a day (QID) | ORAL | 0 refills | Status: AC | PRN

## 2019-12-01 NOTE — ED Triage Notes (Signed)
Pt arrives to ED via POV from home with c/o "a rising bump" on his belly x2 days. Pt shows his stomach with a small red area just below the umbilicus. Pt denies any c/o N/V/D or fever. Pt is A&O, in NAD; RR even, regular, and unlabored.

## 2019-12-01 NOTE — ED Provider Notes (Signed)
[facility] Emergency Department Provider Note  ____________________________________________  Time seen: Approximately 7:28 AM  I have reviewed the triage vital signs and the nursing notes.   HISTORY  Chief Complaint Abscess   HPI Harold Owens is a 49 y.o. male with a history of diabetes and hypertension presents to the ER for evaluation of abscess on his abdomen. He noticed it 3 days ago. Pain has increased. No alleviating measures prior to arrival.   Past Medical History:  Diagnosis Date  . COPD (chronic obstructive pulmonary disease) (HCC)   . Diabetes mellitus without complication (HCC)   . Hypertension     There are no problems to display for this patient.   Past Surgical History:  Procedure Laterality Date  . TONSILLECTOMY    . WRIST SURGERY      Prior to Admission medications   Medication Sig Start Date End Date Taking? Authorizing Provider  albuterol (PROVENTIL) (2.5 MG/3ML) 0.083% nebulizer solution Take 3 mLs (2.5 mg total) by nebulization every 6 (six) hours as needed for wheezing or shortness of breath. 04/29/18   Loleta Rose, MD  albuterol (VENTOLIN HFA) 108 (90 Base) MCG/ACT inhaler Inhale 2 puffs into the lungs every 6 (six) hours as needed. 07/11/15 07/10/16  [provider]  amLODipine (NORVASC) 5 MG tablet Take 1 tablet (5 mg total) by mouth daily. 04/29/18 04/29/19  Loleta Rose, MD  atorvastatin (LIPITOR) 20 MG tablet Take 1 tablet (20 mg total) by mouth daily. 04/29/18 04/29/19  Loleta Rose, MD  cyclobenzaprine (FLEXERIL) 5 MG tablet Take 1 tablet (5 mg total) by mouth at bedtime as needed for muscle spasms. 02/21/19   Menshew, Charlesetta Ivory, PA-C  econazole nitrate 1 % cream Apply 1 application topically 2 (two) times daily. 07/13/15   [provider]  hydrochlorothiazide (MICROZIDE) 12.5 MG capsule Take 1 capsule (12.5 mg total) by mouth daily. 04/29/18 04/29/19  Loleta Rose, MD   HYDROcodone-acetaminophen (NORCO/VICODIN) 5-325 MG tablet Take 1 tablet by mouth every 6 (six) hours as needed for up to 3 days for severe pain. 12/01/19 12/04/19  [place]le Odaniel, Rulon Eisenmenger B, FNP  sildenafil (VIAGRA) 100 MG tablet Take 0.5 mg by mouth daily as needed. 11/30/14   [provider]  sulfamethoxazole-trimethoprim (BACTRIM DS) 800-160 MG tablet Take 1 tablet by mouth 2 (two) times daily. 12/01/19   Chinita Pester, FNP    Allergies Penicillins  Family History  Problem Relation Age of Onset  . Cancer Mother   . Diabetes Sister   . Hypertension Sister   . Hypertension Brother     Social History Social History   Tobacco Use  . Smoking status: Current Every Day Smoker    Packs/day: 1.00    Years: 25.00    Pack years: 25.00    Types: Cigarettes  . Smokeless tobacco: Never Used  Vaping Use  . Vaping Use: Never used  Substance Use Topics  . Alcohol use: No    Alcohol/week: 0.0 standard drinks  . Drug use: No    Review of Systems  Constitutional: Negative for fever. Respiratory: Negative for cough or shortness of breath.  Musculoskeletal: Negative for myalgias Skin: Positive for abscess. Neurological: Negative for numbness or paresthesias. ____________________________________________   PHYSICAL EXAM:  VITAL SIGNS: ED Triage Vitals  Enc Vitals Group     BP 12/01/19 0439 125/66     Pulse Rate 12/01/19 0439 74     Resp 12/01/19 0439 17     Temp 12/01/19 0439 98 F (  36.7 C)     Temp Source 12/01/19 0439 Oral     SpO2 12/01/19 0439 96 %     Weight 12/01/19 0421 (!) 302 lb (137 kg)     Height 12/01/19 0421 5\' 6"  (1.676 m)     Head Circumference --      Peak Flow --      Pain Score 12/01/19 0421 8     Pain Loc --      Pain Edu? --      Excl. in [place]? --      Constitutional: Well appearing. Eyes: Conjunctivae are clear without discharge or drainage. Nose: No rhinorrhea noted. Mouth/Throat: Airway is patent.  Neck: No stridor. Unrestricted range of motion  observed. Cardiovascular: Capillary refill is <3 seconds.  Respiratory: Respirations are even and unlabored.. Musculoskeletal: Unrestricted range of motion observed. Neurologic: Awake, alert, and oriented x 4.  Skin:  3cm fluctuant abscess at the skin fold of the abdominal pannus  ____________________________________________   LABS (all labs ordered are listed, but only abnormal results are displayed)  Labs Reviewed - No data to display ____________________________________________  EKG  Not indicated. ____________________________________________  RADIOLOGY  Not indicated ____________________________________________   PROCEDURES  .07/16/21Incision and Drainage  Date/Time: 12/01/2019 7:53 AM Performed by: 12/03/2019, FNP Authorized by: Chinita Pester, FNP   Consent:    Consent obtained:  Verbal   Consent given by:  Patient   Risks discussed:  Bleeding, infection, incomplete drainage and pain   Alternatives discussed:  Alternative treatment, delayed treatment and observation Location:    Type:  Abscess   Location:  Trunk   Trunk location:  Abdomen Pre-procedure details:    Skin preparation:  Betadine Anesthesia (see MAR for exact dosages):    Anesthesia method:  Local infiltration   Local anesthetic:  Lidocaine 1% w/o epi Procedure type:    Complexity:  Complex Procedure details:    Incision types:  Single straight   Incision depth:  Dermal   Scalpel blade:  11   Wound management:  Probed and deloculated   Drainage:  Purulent and bloody   Drainage amount:  Moderate   Wound treatment:  Drain placed   Packing materials:  1/4 in iodoform gauze Post-procedure details:    Patient tolerance of procedure:  Tolerated well, no immediate complications   ____________________________________________   INITIAL IMPRESSION / ASSESSMENT AND PLAN / ED COURSE  Harold Owens is a 49 y.o. male presents to the emergency department for treatment evaluation of 3 days  of skin abscess.  He has had multiple abscesses in the past.  See HPI for further details.  I&D performed as above.  Patient tolerated procedure well.  Wound care instructions were discussed and he will remove the packing in 2 days.  He will be prescribed Bactrim and Norco.  If he feels that his symptoms are getting worse he needs to either follow-up with primary care or return to the emergency department.   Medications  lidocaine (PF) (XYLOCAINE) 1 % injection 5 mL (has no administration in time range)     Pertinent labs & imaging results that were available during my care of the patient were reviewed by me and considered in my medical decision making (see chart for details).  ____________________________________________   FINAL CLINICAL IMPRESSION(S) / ED DIAGNOSES  Final diagnoses:  Abscess    ED Discharge Orders         Ordered    sulfamethoxazole-trimethoprim (BACTRIM DS) 800-160 MG tablet  2  times daily     Discontinue  Reprint     12/01/19 0755    HYDROcodone-acetaminophen (NORCO/VICODIN) 5-325 MG tablet  Every 6 hours PRN     Discontinue  Reprint     12/01/19 0755           Note:  This document was prepared using Dragon voice recognition software and may include unintentional dictation errors.   Chinita Pester, FNP 12/01/19 0756    Emily Filbert, MD 12/01/19 [phone]

## 2019-12-01 NOTE — Discharge Instructions (Signed)
Pull the packing out in 2 to 3 days. See primary care if you are not getting any better within the next 2 to 3 days as well. For symptoms of change or worsen if you are unable to see your primary care provider please return to the emergency department.

## 2019-12-01 NOTE — ED Notes (Signed)
See triage note  Presents with possible abscess area to abd   States he noticed a "bump" to lower abd couple of days ago  Area is red and slightly swollen  No fever

## 2020-03-16 ENCOUNTER — Other Ambulatory Visit: Payer: Self-pay

## 2020-03-16 ENCOUNTER — Emergency Department
Admission: EM | Admit: 2020-03-16 | Discharge: 2020-03-16 | Disposition: A | Discharge status: Home / Self Care | Payer: BC Managed Care – PPO | Attending: Emergency Medicine | Admitting: Emergency Medicine

## 2020-03-16 DIAGNOSIS — I10 Essential (primary) hypertension: Secondary | ICD-10-CM | POA: Insufficient documentation

## 2020-03-16 DIAGNOSIS — F17210 Nicotine dependence, cigarettes, uncomplicated: Secondary | ICD-10-CM | POA: Insufficient documentation

## 2020-03-16 DIAGNOSIS — J449 Chronic obstructive pulmonary disease, unspecified: Secondary | ICD-10-CM | POA: Insufficient documentation

## 2020-03-16 DIAGNOSIS — M25561 Pain in right knee: Secondary | ICD-10-CM | POA: Insufficient documentation

## 2020-03-16 DIAGNOSIS — Z79899 Other long term (current) drug therapy: Secondary | ICD-10-CM | POA: Insufficient documentation

## 2020-03-16 DIAGNOSIS — G8929 Other chronic pain: Secondary | ICD-10-CM | POA: Insufficient documentation

## 2020-03-16 DIAGNOSIS — E119 Type 2 diabetes mellitus without complications: Secondary | ICD-10-CM | POA: Insufficient documentation

## 2020-03-16 MED ORDER — DICLOFENAC SODIUM 75 MG PO TBEC: 75.0000 mg | DELAYED_RELEASE_TABLET | Freq: Two times a day (BID) | ORAL | 0 refills | Status: DC

## 2020-03-16 NOTE — ED Triage Notes (Signed)
Right knee pain recurrent that became 10/10 this AM. No known injury, has worked lots of hours this month.

## 2020-03-16 NOTE — ED Provider Notes (Signed)
[facility] Emergency Department Provider Note   ____________________________________________   First MD Initiated Contact with Patient 03/16/20 [phone]     (approximate)  I have reviewed the triage vital signs and the nursing notes.   HISTORY  Chief Complaint Knee Pain    HPI Harold Owens is a 49 y.o. male complain of right knee pain.  Patient that is a recurrent condition but is worsened today.  Patient state no specific provocative measures except for working long hours this month.  Rates pain as a 10/10.  Described pain is "achy".  No palliative measure for complaint.         Past Medical History:  Diagnosis Date  . COPD (chronic obstructive pulmonary disease) (HCC)   . Diabetes mellitus without complication (HCC)   . Hypertension     There are no problems to display for this patient.   Past Surgical History:  Procedure Laterality Date  . TONSILLECTOMY    . WRIST SURGERY      Prior to Admission medications   Medication Sig Start Date End Date Taking? Authorizing Provider  albuterol (PROVENTIL) (2.5 MG/3ML) 0.083% nebulizer solution Take 3 mLs (2.5 mg total) by nebulization every 6 (six) hours as needed for wheezing or shortness of breath. 04/29/18   Loleta Rose, MD  albuterol (VENTOLIN HFA) 108 (90 Base) MCG/ACT inhaler Inhale 2 puffs into the lungs every 6 (six) hours as needed. 07/11/15 07/10/16  [provider]  amLODipine (NORVASC) 5 MG tablet Take 1 tablet (5 mg total) by mouth daily. 04/29/18 04/29/19  Loleta Rose, MD  atorvastatin (LIPITOR) 20 MG tablet Take 1 tablet (20 mg total) by mouth daily. 04/29/18 04/29/19  Loleta Rose, MD  cyclobenzaprine (FLEXERIL) 5 MG tablet Take 1 tablet (5 mg total) by mouth at bedtime as needed for muscle spasms. 02/21/19   Menshew, Charlesetta Ivory, PA-C  diclofenac (VOLTAREN) 75 MG EC tablet Take 1 tablet (75 mg total) by mouth 2 (two) times daily. 03/16/20   Joni Reining, PA-C    econazole nitrate 1 % cream Apply 1 application topically 2 (two) times daily. 07/13/15   [provider]  hydrochlorothiazide (MICROZIDE) 12.5 MG capsule Take 1 capsule (12.5 mg total) by mouth daily. 04/29/18 04/29/19  Loleta Rose, MD  sildenafil (VIAGRA) 100 MG tablet Take 0.5 mg by mouth daily as needed. 11/30/14   [provider]  sulfamethoxazole-trimethoprim (BACTRIM DS) 800-160 MG tablet Take 1 tablet by mouth 2 (two) times daily. 12/01/19   Chinita Pester, FNP    Allergies Penicillins  Family History  Problem Relation Age of Onset  . Cancer Mother   . Diabetes Sister   . Hypertension Sister   . Hypertension Brother     Social History Social History   Tobacco Use  . Smoking status: Current Every Day Smoker    Packs/day: 1.00    Years: 25.00    Pack years: 25.00    Types: Cigarettes  . Smokeless tobacco: Never Used  Vaping Use  . Vaping Use: Never used  Substance Use Topics  . Alcohol use: No    Alcohol/week: 0.0 standard drinks  . Drug use: No    Review of Systems  Constitutional: No fever/chills Eyes: No visual changes. ENT: No sore throat. Cardiovascular: Denies chest pain. Respiratory: Denies shortness of breath. Gastrointestinal: No abdominal pain.  No nausea, no vomiting.  No diarrhea.  No constipation. Genitourinary: Negative for dysuria. Musculoskeletal: Right knee pain. Skin: Negative for rash. Neurological:  Negative for headaches, focal weakness or numbness. Endocrine:  Diabetes and hypertension Allergic/Immunilogical: Penicillin  ____________________________________________   PHYSICAL EXAM:  VITAL SIGNS: ED Triage Vitals [03/16/20 0839]  Enc Vitals Group     BP 117/66     Pulse Rate 77     Resp 18     Temp 98.5 F (36.9 C)     Temp Source Oral     SpO2 100 %     Weight (!) 307 lb (139.3 kg)     Height 5\' 6"  (1.676 m)     Head Circumference      Peak Flow      Pain Score 10     Pain Loc      Pain Edu?       Excl. in GC?    Constitutional: Alert and oriented. Well appearing and in no acute distress. Cardiovascular: Normal rate, regular rhythm. Grossly normal heart sounds.  Good peripheral circulation. Respiratory: Normal respiratory effort.  No retractions. Lungs CTAB. Musculoskeletal: No obvious deformity to the right knee.  No crepitus with palpation.  No joint effusion.  Patient is moderate guarding palpation anterior patella.  Has full neck range of motion.. Neurologic:  Normal speech and language. No gross focal neurologic deficits are appreciated. No gait instability. Skin:  Skin is warm, dry and intact. No rash noted. Psychiatric: Mood and affect are normal. Speech and behavior are normal.  ____________________________________________   LABS (all labs ordered are listed, but only abnormal results are displayed)  Labs Reviewed - No data to display ____________________________________________  EKG   ____________________________________________  RADIOLOGY I, , personally viewed and evaluated these images (plain radiographs) as part of my medical decision making, as well as reviewing the written report by the radiologist.  ED MD interpretation:    Official radiology report(s): No results found.  ____________________________________________   PROCEDURES  Procedure(s) performed (including Critical Care):  Procedures   ____________________________________________   INITIAL IMPRESSION / ASSESSMENT AND PLAN / ED COURSE  As part of my medical decision making, I reviewed the following data within the electronic MEDICAL RECORD NUMBER     Patient presents with right knee pain.  This intermittent condition that increases with overuse.  Patient given discharge care instruction advised follow-up PCP.  Patient given elastic knee support and a prescription for Voltaren.          ____________________________________________   FINAL CLINICAL IMPRESSION(S) / ED  DIAGNOSES  Final diagnoses:  Chronic pain of right knee     ED Discharge Orders         Ordered    diclofenac (VOLTAREN) 75 MG EC tablet  2 times daily        03/16/20 03/18/20          *Please note:  Harold Owens was evaluated in Emergency Department on 03/16/2020 for the symptoms described in the history of present illness. He was evaluated in the context of the global COVID-19 pandemic, which necessitated consideration that the patient might be at risk for infection with the SARS-CoV-2 virus that causes COVID-19. Institutional protocols and algorithms that pertain to the evaluation of patients at risk for COVID-19 are in a state of rapid change based on information released by regulatory bodies including the CDC and federal and state organizations. These policies and algorithms were followed during the patient's care in the ED.  Some ED evaluations and interventions may be delayed as a result of limited staffing during and the pandemic.*   Note:  This document was prepared using Dragon voice recognition software and may include unintentional dictation errors.    Joni Reining, PA-C 03/16/20 3149    Sharman Cheek, MD 03/20/20 [phone]

## 2020-03-16 NOTE — Discharge Instructions (Addendum)
Wear knee support and take medication.  Follow-up with PCP.

## 2020-03-28 ENCOUNTER — Other Ambulatory Visit: Payer: Self-pay | Admitting: Physician Assistant

## 2020-09-13 ENCOUNTER — Other Ambulatory Visit: Payer: Self-pay

## 2020-09-13 ENCOUNTER — Ambulatory Visit: Payer: Self-pay | Admitting: Physician Assistant

## 2020-09-13 DIAGNOSIS — Z113 Encounter for screening for infections with a predominantly sexual mode of transmission: Secondary | ICD-10-CM

## 2020-09-13 DIAGNOSIS — Z202 Contact with and (suspected) exposure to infections with a predominantly sexual mode of transmission: Secondary | ICD-10-CM

## 2020-09-13 LAB — GRAM STAIN

## 2020-09-13 MED ORDER — METRONIDAZOLE 500 MG PO TABS: 500.0000 mg | ORAL_TABLET | Freq: Two times a day (BID) | ORAL | 0 refills | Status: AC

## 2020-09-14 ENCOUNTER — Encounter: Payer: Self-pay | Admitting: Physician Assistant

## 2020-09-14 NOTE — Progress Notes (Signed)
[facility] Department STI clinic/screening visit  Subjective:  Harold Owens is a 50 y.o. male being seen today for an STI screening visit. The patient reports they do have symptoms.    Patient has the following medical conditions:  There are no problems to display for this patient.    Chief Complaint  Patient presents with  . SEXUALLY TRANSMITTED DISEASE    screening    HPI  Patient reports that he is a contact to Trich and has been having a "tingling" in the head of his penis for 1-2 weeks.  Denies other symptoms.  Reports that he takes medicines as directed for DM, HTN, COPD, and high cholesterol.  Reports that his last HIV test was in 2021 and last void prior to sample collection for Gram stain was about 2 hr ago.   See flowsheet for further details and programmatic requirements.    The following portions of the patient's history were reviewed and updated as appropriate: allergies, current medications, past medical history, past social history, past surgical history and problem list.  Objective:  There were no vitals filed for this visit.  Physical Exam Constitutional:      General: He is not in acute distress.    Appearance: Normal appearance.  HENT:     Head: Normocephalic and atraumatic.     Comments: No nits,lice, or hair loss. No cervical, supraclavicular or axillary adenopathy.    Mouth/Throat:     Mouth: Mucous membranes are moist.     Pharynx: Oropharynx is clear. No oropharyngeal exudate or posterior oropharyngeal erythema.  Eyes:     Conjunctiva/sclera: Conjunctivae normal.  Pulmonary:     Effort: Pulmonary effort is normal.  Abdominal:     Palpations: Abdomen is soft. There is no mass.     Tenderness: There is no abdominal tenderness. There is no guarding or rebound.  Genitourinary:    Penis: Normal.      Testes: Normal.     Comments: Pubic area without nits, lice, hair loss, edema, erythema, lesions and inguinal  adenopathy. Penis uncircumcised without rash, lesions and discharge at meatus. Testicles descended bilaterally,nt, no masses or edema. Musculoskeletal:     Cervical back: Neck supple. No tenderness.  Skin:    General: Skin is warm and dry.     Findings: No bruising, erythema, lesion or rash.  Neurological:     Mental Status: He is alert and oriented to person, place, and time.  Psychiatric:        Mood and Affect: Mood normal.        Behavior: Behavior normal.        Thought Content: Thought content normal.        Judgment: Judgment normal.       Assessment and Plan:  Harold Owens is a 50 y.o. male presenting to the [facility] Department for STI screening  1. Screening for STD (sexually transmitted disease) Patient into clinic without symptoms. Patient declines blood work today. Reviewed with patient Gram stain results and that it is not likely that Trich will be seen on a wet mount. Rec condoms with all sex. Await test results.  Counseled that RN will call if needs to RTC for treatment once results are back. - Gram stain - Gonococcus culture - Gonococcus culture  2. Trichomonas contact Will treat as a contact to Trich with Metronidazole 500 mg #14 1 po BID for 7 days with food, no EtOH for 24 hr before and until  72 hr after completing medicine. No sex for 14 days and until after partner completes treatment. - metroNIDAZOLE (FLAGYL) 500 MG tablet; Take 1 tablet (500 mg total) by mouth 2 (two) times daily for 7 days.  Dispense: 14 tablet; Refill: 0     No follow-ups on file.  No future appointments.  Matt Holmes, PA

## 2020-09-17 LAB — GONOCOCCUS CULTURE

## 2020-09-17 NOTE — Progress Notes (Signed)
Chart reviewed by Pharmacist  Suzanne Walker PharmD, Contract Pharmacist at [facility]  

## 2020-10-04 ENCOUNTER — Emergency Department
Admission: EM | Admit: 2020-10-04 | Discharge: 2020-10-04 | Disposition: A | Discharge status: Home / Self Care | Payer: BC Managed Care – PPO | Attending: Emergency Medicine | Admitting: Emergency Medicine

## 2020-10-04 ENCOUNTER — Other Ambulatory Visit: Payer: Self-pay

## 2020-10-04 DIAGNOSIS — Z5321 Procedure and treatment not carried out due to patient leaving prior to being seen by health care provider: Secondary | ICD-10-CM | POA: Insufficient documentation

## 2020-10-04 DIAGNOSIS — R059 Cough, unspecified: Secondary | ICD-10-CM | POA: Diagnosis not present

## 2020-10-04 NOTE — ED Provider Notes (Signed)
Patient apparently left without being seen.  Patient had been in the emergency department just over an hour, had been triaged, put in a room but I had not seen the patient at this time.  Apparently patient left without being seen.  I have neither seen nor provided treatment for the patient at this time.   Lanette Hampshire 10/04/20 2119    Shaune Pollack, MD 10/10/20 [phone]

## 2020-10-04 NOTE — ED Triage Notes (Signed)
Pt states for the past week he has had a cough that is worse at night time, Pt states cough is productive and sputum is clear. Pt states this all started after he was dx with a sinus infection recently.

## 2020-11-28 ENCOUNTER — Emergency Department: Payer: BC Managed Care – PPO

## 2020-11-28 ENCOUNTER — Emergency Department
Admission: EM | Admit: 2020-11-28 | Discharge: 2020-11-28 | Disposition: A | Discharge status: Home / Self Care | Payer: BC Managed Care – PPO | Attending: Emergency Medicine | Admitting: Emergency Medicine

## 2020-11-28 ENCOUNTER — Other Ambulatory Visit: Payer: Self-pay

## 2020-11-28 ENCOUNTER — Encounter: Payer: Self-pay | Admitting: Radiology

## 2020-11-28 DIAGNOSIS — J449 Chronic obstructive pulmonary disease, unspecified: Secondary | ICD-10-CM | POA: Diagnosis not present

## 2020-11-28 DIAGNOSIS — E119 Type 2 diabetes mellitus without complications: Secondary | ICD-10-CM | POA: Diagnosis not present

## 2020-11-28 DIAGNOSIS — I10 Essential (primary) hypertension: Secondary | ICD-10-CM | POA: Insufficient documentation

## 2020-11-28 DIAGNOSIS — Z79899 Other long term (current) drug therapy: Secondary | ICD-10-CM | POA: Diagnosis not present

## 2020-11-28 DIAGNOSIS — F17210 Nicotine dependence, cigarettes, uncomplicated: Secondary | ICD-10-CM | POA: Diagnosis not present

## 2020-11-28 DIAGNOSIS — S76012A Strain of muscle, fascia and tendon of left hip, initial encounter: Secondary | ICD-10-CM | POA: Insufficient documentation

## 2020-11-28 DIAGNOSIS — X501XXA Overexertion from prolonged static or awkward postures, initial encounter: Secondary | ICD-10-CM | POA: Diagnosis not present

## 2020-11-28 DIAGNOSIS — S73102A Unspecified sprain of left hip, initial encounter: Secondary | ICD-10-CM

## 2020-11-28 DIAGNOSIS — S79912A Unspecified injury of left hip, initial encounter: Secondary | ICD-10-CM | POA: Diagnosis present

## 2020-11-28 MED ORDER — OXYCODONE-ACETAMINOPHEN 5-325 MG PO TABS
1.0000 | ORAL_TABLET | Freq: Once | ORAL | Status: AC
  Administered 2020-11-28: 1 via ORAL
  Filled 2020-11-28: qty 1

## 2020-11-28 MED ORDER — OXYCODONE-ACETAMINOPHEN 5-325 MG PO TABS: 1.0000 | ORAL_TABLET | ORAL | 0 refills | Status: DC | PRN

## 2020-11-28 MED ORDER — ACETAMINOPHEN 325 MG PO TABS
650.0000 mg | ORAL_TABLET | Freq: Once | ORAL | Status: AC
  Administered 2020-11-28: 650 mg via ORAL
  Filled 2020-11-28: qty 2

## 2020-11-28 NOTE — ED Triage Notes (Signed)
Pt in with co left hip pain that radiates to left foot. Pt denies any injury but states pain worse when he walks or moves.

## 2020-11-28 NOTE — ED Provider Notes (Signed)
[facility] Emergency Department Provider Note  ____________________________________________  Time seen: Approximately 5:39 AM  I have reviewed the triage vital signs and the nursing notes.   HISTORY  Chief Complaint Hip Pain   HPI Harold Owens is a 50 y.o. male with a history of COPD, diabetes, hypertension, obesity who presents for evaluation of hip pain.  Patient reports that last night he was in bed trying to get up to go to the bathroom.  When he twisted out of bed he developed a sharp pain on the left lateral hip radiating all the way to the foot through the lateral aspect of the leg.  He went to the bathroom and went back to sleep.  This morning when he woke up the pain was still persistent which prompted visit to the emergency room.  He denies back pain, saddle anesthesia, lower extremity weakness or numbness.  Denies any known trauma.  Patient does report that he has been overdoing it at work and he spends most of the day on his feet and walking.  He denies abdominal pain, scrotal pain.  Denies any prior history of hip problems.   Past Medical History:  Diagnosis Date   COPD (chronic obstructive pulmonary disease) (HCC)    Diabetes mellitus without complication (HCC)    Hypertension     There are no problems to display for this patient.   Past Surgical History:  Procedure Laterality Date   TONSILLECTOMY     WRIST SURGERY      Prior to Admission medications   Medication Sig Start Date End Date Taking? Authorizing Provider  oxyCODONE-acetaminophen (PERCOCET) 5-325 MG tablet Take 1 tablet by mouth every 4 (four) hours as needed. 11/28/20  Yes Don Perking, [place], MD  albuterol (PROVENTIL) (2.5 MG/3ML) 0.083% nebulizer solution Take 3 mLs (2.5 mg total) by nebulization every 6 (six) hours as needed for wheezing or shortness of breath. 04/29/18   Loleta Rose, MD  albuterol (VENTOLIN HFA) 108 (90 Base) MCG/ACT inhaler Inhale 2 puffs into the  lungs every 6 (six) hours as needed. 07/11/15 07/10/16  [provider]  amLODipine (NORVASC) 5 MG tablet Take 1 tablet (5 mg total) by mouth daily. 04/29/18 04/29/19  Loleta Rose, MD  atorvastatin (LIPITOR) 20 MG tablet Take 1 tablet (20 mg total) by mouth daily. 04/29/18 04/29/19  Loleta Rose, MD  cyclobenzaprine (FLEXERIL) 5 MG tablet Take 1 tablet (5 mg total) by mouth at bedtime as needed for muscle spasms. Patient not taking: Reported on 09/14/2020 02/21/19   Menshew, Charlesetta Ivory, PA-C  diclofenac (VOLTAREN) 75 MG EC tablet Take 1 tablet (75 mg total) by mouth 2 (two) times daily. Patient not taking: Reported on 09/14/2020 03/16/20   Joni Reining, PA-C  econazole nitrate 1 % cream Apply 1 application topically 2 (two) times daily. 07/13/15   [provider]  hydrochlorothiazide (MICROZIDE) 12.5 MG capsule Take 1 capsule (12.5 mg total) by mouth daily. 04/29/18 04/29/19  Loleta Rose, MD  sildenafil (VIAGRA) 100 MG tablet Take 0.5 mg by mouth daily as needed. 11/30/14   [provider]  sulfamethoxazole-trimethoprim (BACTRIM DS) 800-160 MG tablet Take 1 tablet by mouth 2 (two) times daily. Patient not taking: Reported on 09/14/2020 12/01/19   Chinita Pester, FNP    Allergies Penicillins  Family History  Problem Relation Age of Onset   Cancer Mother    Diabetes Sister    Hypertension Sister    Hypertension Brother     Social  History Social History   Tobacco Use   Smoking status: Every Day    Packs/day: 1.00    Years: 25.00    Pack years: 25.00    Types: Cigarettes   Smokeless tobacco: Never  Vaping Use   Vaping Use: Never used  Substance Use Topics   Alcohol use: No    Alcohol/week: 0.0 standard drinks   Drug use: No    Review of Systems  Constitutional: Negative for fever. Eyes: Negative for visual changes. ENT: Negative for sore throat. Neck: No neck pain  Cardiovascular: Negative for chest pain. Respiratory: Negative for  shortness of breath. Gastrointestinal: Negative for abdominal pain, vomiting or diarrhea. Genitourinary: Negative for dysuria. Musculoskeletal: Negative for back pain. + L hip pain Skin: Negative for rash. Neurological: Negative for headaches, weakness or numbness. Psych: No SI or HI  ____________________________________________   PHYSICAL EXAM:  VITAL SIGNS: ED Triage Vitals [11/28/20 0524]  Enc Vitals Group     BP 122/80     Pulse Rate 79     Resp 20     Temp (!) 97.5 F (36.4 C)     Temp Source Oral     SpO2 99 %     Weight 300 lb (136.1 kg)     Height 5\' 6"  (1.676 m)     Head Circumference      Peak Flow      Pain Score 9     Pain Loc      Pain Edu?      Excl. in GC?     Constitutional: Alert and oriented. Well appearing and in no apparent distress. HEENT:      Head: Normocephalic and atraumatic.         Eyes: Conjunctivae are normal. Sclera is non-icteric.       Mouth/Throat: Mucous membranes are moist.       Neck: Supple with no signs of meningismus. Cardiovascular: Regular rate and rhythm. No murmurs, gallops, or rubs.  Respiratory: Normal respiratory effort. Lungs are clear to auscultation bilaterally.  Gastrointestinal: Soft, non tender, and non distended. Genitourinary: Normal scrotum, no inguinal hernia Musculoskeletal:  No edema, cyanosis, or erythema of extremities.  Patient is tender to palpation on the proximal lateral aspect of the left hip.  Has almost intact range of motion with minimal discomfort.  There is no swelling or erythema of the joint.  There is no tenderness to the sciatic notch.  There is no midline spine tenderness.  He has strong distal pulses and brisk capillary few of the left lower extremity.  No asymmetric swelling or any signs of cellulitis Neurologic: Normal speech and language. Face is symmetric. Moving all extremities. No gross focal neurologic deficits are appreciated. Skin: Skin is warm, dry and intact. No rash  noted. Psychiatric: Mood and affect are normal. Speech and behavior are normal.  ____________________________________________   LABS (all labs ordered are listed, but only abnormal results are displayed)  Labs Reviewed - No data to display ____________________________________________  EKG  none  ____________________________________________  RADIOLOGY  I have personally reviewed the images performed during this visit and I agree with the Radiologist's read.   Interpretation by Radiologist:  DG Hip Unilat W or Wo Pelvis 2-3 Views Left  Result Date: 11/28/2020 CLINICAL DATA:  50 year old male with left hip pain radiating to the foot. No known injury. EXAM: DG HIP (WITH OR WITHOUT PELVIS) 2-3V LEFT COMPARISON:  None. FINDINGS: Femoral heads are normally located. Pelvis appears intact. Bone mineralization is within  normal limits. Grossly intact proximal right femur. Intact proximal left femur. No degeneration identified at the left hip. Negative visible bowel gas pattern.  Incidental pelvic phleboliths. IMPRESSION: No osseous abnormality identified about the left hip or pelvis. Electronically Signed   By: Odessa Fleming M.D.   On: 11/28/2020 06:43     ____________________________________________   PROCEDURES  Procedure(s) performed: None Procedures Critical Care performed:  None ____________________________________________   INITIAL IMPRESSION / ASSESSMENT AND PLAN / ED COURSE  50 y.o. male with a history of COPD, diabetes, hypertension, obesity who presents for evaluation of hip pain patient with sudden onset of left lateral hip pain radiating down to his foot after twisting and trying to get up from bed last night.  On exam there is no signs of cellulitis, asymmetric edema, neurodeficits, arterial occlusion, septic joint, gout, cauda equina.  Most likely sprain versus arthritis versus occult fracture.  X-ray visualized by me showed no active pathology.  Will treat with Tylenol and  oxycodone.  Old medical records reviewed.  Presentation most likely a sprain.  Will send home on a short course of Percocet.  Recommended close follow-up with PCP.  Discussed my standard return precautions for worsening pain, weakness or numbness of his leg or back pain.      _____________________________________________ Please note:  Patient was evaluated in Emergency Department today for the symptoms described in the history of present illness. Patient was evaluated in the context of the global COVID-19 pandemic, which necessitated consideration that the patient might be at risk for infection with the SARS-CoV-2 virus that causes COVID-19. Institutional protocols and algorithms that pertain to the evaluation of patients at risk for COVID-19 are in a state of rapid change based on information released by regulatory bodies including the CDC and federal and state organizations. These policies and algorithms were followed during the patient's care in the ED.  Some ED evaluations and interventions may be delayed as a result of limited staffing during the pandemic.   [place] Controlled Substance Database was reviewed by me. ____________________________________________   FINAL CLINICAL IMPRESSION(S) / ED DIAGNOSES   Final diagnoses:  Hip sprain, left, initial encounter      NEW MEDICATIONS STARTED DURING THIS VISIT:  ED Discharge Orders          Ordered    oxyCODONE-acetaminophen (PERCOCET) 5-325 MG tablet  Every 4 hours PRN        11/28/20 0653             Note:  This document was prepared using Dragon voice recognition software and may include unintentional dictation errors.    Don Perking, [place], MD 11/28/20 [phone]

## 2021-01-11 ENCOUNTER — Other Ambulatory Visit: Payer: Self-pay

## 2021-01-11 ENCOUNTER — Emergency Department
Admission: EM | Admit: 2021-01-11 | Discharge: 2021-01-11 | Disposition: A | Discharge status: Home / Self Care | Payer: BC Managed Care – PPO | Attending: Emergency Medicine | Admitting: Emergency Medicine

## 2021-01-11 ENCOUNTER — Emergency Department: Payer: BC Managed Care – PPO

## 2021-01-11 ENCOUNTER — Encounter: Payer: Self-pay | Admitting: Emergency Medicine

## 2021-01-11 DIAGNOSIS — Z79899 Other long term (current) drug therapy: Secondary | ICD-10-CM | POA: Insufficient documentation

## 2021-01-11 DIAGNOSIS — J449 Chronic obstructive pulmonary disease, unspecified: Secondary | ICD-10-CM | POA: Insufficient documentation

## 2021-01-11 DIAGNOSIS — E119 Type 2 diabetes mellitus without complications: Secondary | ICD-10-CM | POA: Insufficient documentation

## 2021-01-11 DIAGNOSIS — M25561 Pain in right knee: Secondary | ICD-10-CM | POA: Diagnosis present

## 2021-01-11 DIAGNOSIS — I10 Essential (primary) hypertension: Secondary | ICD-10-CM | POA: Insufficient documentation

## 2021-01-11 DIAGNOSIS — M1990 Unspecified osteoarthritis, unspecified site: Secondary | ICD-10-CM | POA: Insufficient documentation

## 2021-01-11 DIAGNOSIS — F17210 Nicotine dependence, cigarettes, uncomplicated: Secondary | ICD-10-CM | POA: Diagnosis not present

## 2021-01-11 DIAGNOSIS — M1711 Unilateral primary osteoarthritis, right knee: Secondary | ICD-10-CM

## 2021-01-11 MED ORDER — MELOXICAM 15 MG PO TABS: 15.0000 mg | ORAL_TABLET | Freq: Every day | ORAL | 2 refills | Status: AC

## 2021-01-11 NOTE — ED Triage Notes (Signed)
Patient ambulatory to triage with steady gait, without difficulty or distress noted; pt reports rt knee pain "for years"; denies specific injury

## 2021-01-11 NOTE — ED Provider Notes (Signed)
[facility] Emergency Department Provider Note  ____________________________________________   Event Date/Time   First MD Initiated Contact with Patient 01/11/21 [phone]     (approximate)  I have reviewed the triage vital signs and the nursing notes.   HISTORY  Chief Complaint Knee Pain    HPI Harold Owens is a 50 y.o. male presents emergency department complaining of right knee pain.  Patient states he has very little meniscus left.  Increased pain with walking.  States at work he is walking on concrete floors causing more pain.  No known injury.  No swelling or numbness.  Symptoms have been ongoing for years and increased over the last 2 days  Past Medical History:  Diagnosis Date   COPD (chronic obstructive pulmonary disease) (HCC)    Diabetes mellitus without complication (HCC)    Hypertension     There are no problems to display for this patient.   Past Surgical History:  Procedure Laterality Date   TONSILLECTOMY     WRIST SURGERY      Prior to Admission medications   Medication Sig Start Date End Date Taking? Authorizing Provider  meloxicam (MOBIC) 15 MG tablet Take 1 tablet (15 mg total) by mouth daily. 01/11/21 01/11/22 Yes Marston Mccadden, Roselyn Bering, PA-C  albuterol (PROVENTIL) (2.5 MG/3ML) 0.083% nebulizer solution Take 3 mLs (2.5 mg total) by nebulization every 6 (six) hours as needed for wheezing or shortness of breath. 04/29/18   Loleta Rose, MD  albuterol (VENTOLIN HFA) 108 (90 Base) MCG/ACT inhaler Inhale 2 puffs into the lungs every 6 (six) hours as needed. 07/11/15 07/10/16  [provider]  amLODipine (NORVASC) 5 MG tablet Take 1 tablet (5 mg total) by mouth daily. 04/29/18 04/29/19  Loleta Rose, MD  atorvastatin (LIPITOR) 20 MG tablet Take 1 tablet (20 mg total) by mouth daily. 04/29/18 04/29/19  Loleta Rose, MD  hydrochlorothiazide (MICROZIDE) 12.5 MG capsule Take 1 capsule (12.5 mg total) by mouth daily. 04/29/18 04/29/19   Loleta Rose, MD  sildenafil (VIAGRA) 100 MG tablet Take 0.5 mg by mouth daily as needed. 11/30/14   [provider]    Allergies Penicillins  Family History  Problem Relation Age of Onset   Cancer Mother    Diabetes Sister    Hypertension Sister    Hypertension Brother     Social History Social History   Tobacco Use   Smoking status: Every Day    Packs/day: 1.00    Years: 25.00    Pack years: 25.00    Types: Cigarettes   Smokeless tobacco: Never  Vaping Use   Vaping Use: Never used  Substance Use Topics   Alcohol use: No    Alcohol/week: 0.0 standard drinks   Drug use: No    Review of Systems  Constitutional: No fever/chills Eyes: No visual changes. ENT: No sore throat. Respiratory: Denies cough Cardiovascular: Denies chest pain Gastrointestinal: Denies abdominal pain Genitourinary: Negative for dysuria. Musculoskeletal: Negative for back pain.  Positive right knee pain Skin: Negative for rash. Psychiatric: no mood changes,     ____________________________________________   PHYSICAL EXAM:  VITAL SIGNS: ED Triage Vitals  Enc Vitals Group     BP 01/11/21 0547 116/74     Pulse Rate 01/11/21 0547 80     Resp 01/11/21 0547 20     Temp 01/11/21 0547 98.6 F (37 C)     Temp Source 01/11/21 0547 Oral     SpO2 01/11/21 0547 95 %  Weight 01/11/21 0548 (!) 305 lb (138.3 kg)     Height 01/11/21 0548 5\' 6"  (1.676 m)     Head Circumference --      Peak Flow --      Pain Score 01/11/21 0547 9     Pain Loc --      Pain Edu? --      Excl. in [place]? --     Constitutional: Alert and oriented. Well appearing and in no acute distress. Eyes: Conjunctivae are normal.  Head: Atraumatic. Nose: No congestion/rhinnorhea. Mouth/Throat: Mucous membranes are moist.   Neck:  supple no lymphadenopathy noted Cardiovascular: Normal rate, regular rhythm.  Respiratory: Normal respiratory effort.  No retractions, GU: deferred Musculoskeletal: FROM all  extremities, warm and well perfused, right knee is tender at the joint line, full range of motion, no crepitus, neurovascular is intact Neurologic:  Normal speech and language.  Skin:  Skin is warm, dry and intact. No rash noted. Psychiatric: Mood and affect are normal. Speech and behavior are normal.  ____________________________________________   LABS (all labs ordered are listed, but only abnormal results are displayed)  Labs Reviewed - No data to display ____________________________________________   ____________________________________________  RADIOLOGY  X-ray of the right knee  ____________________________________________   PROCEDURES  Procedure(s) performed: Knee brace applied by nursing staff   Procedures    ____________________________________________   INITIAL IMPRESSION / ASSESSMENT AND PLAN / ED COURSE  Pertinent labs & imaging results that were available during my care of the patient were reviewed by me and considered in my medical decision making (see chart for details).   The patient is a 51 year old male presents emergency department with right knee pain.  See HPI.  Physical exam shows patient appears stable.  X-ray of the right knee reviewed by me and confirmed by radiology to have some degenerative changes.  Patient was given all test results.  We did discuss orthopedist.  He is to follow-up with orthopedic at [facility].  Wear the knee brace for extra support.  Take meloxicam daily.  Apply ice.  He was discharged stable condition.     Harold Owens was evaluated in Emergency Department on 01/11/2021 for the symptoms described in the history of present illness. He was evaluated in the context of the global COVID-19 pandemic, which necessitated consideration that the patient might be at risk for infection with the SARS-CoV-2 virus that causes COVID-19. Institutional protocols and algorithms that pertain to the evaluation of patients at risk for  COVID-19 are in a state of rapid change based on information released by regulatory bodies including the CDC and federal and state organizations. These policies and algorithms were followed during the patient's care in the ED.    As part of my medical decision making, I reviewed the following data within the electronic MEDICAL RECORD NUMBER Nursing notes reviewed and incorporated, Old chart reviewed, Radiograph reviewed , Notes from prior ED visits, and [place] Controlled Substance Database  ____________________________________________   FINAL CLINICAL IMPRESSION(S) / ED DIAGNOSES  Final diagnoses:  Arthritis of right knee      NEW MEDICATIONS STARTED DURING THIS VISIT:  New Prescriptions   MELOXICAM (MOBIC) 15 MG TABLET    Take 1 tablet (15 mg total) by mouth daily.     Note:  This document was prepared using Dragon voice recognition software and may include unintentional dictation errors.    01/13/2021, PA-C 01/11/21 01/13/21    3220, MD 01/11/21 1556

## 2021-01-30 ENCOUNTER — Other Ambulatory Visit: Payer: Self-pay

## 2021-01-30 ENCOUNTER — Emergency Department: Payer: BC Managed Care – PPO

## 2021-01-30 ENCOUNTER — Emergency Department
Admission: EM | Admit: 2021-01-30 | Discharge: 2021-01-30 | Disposition: A | Discharge status: Home / Self Care | Payer: BC Managed Care – PPO | Attending: Emergency Medicine | Admitting: Emergency Medicine

## 2021-01-30 DIAGNOSIS — J449 Chronic obstructive pulmonary disease, unspecified: Secondary | ICD-10-CM | POA: Insufficient documentation

## 2021-01-30 DIAGNOSIS — I10 Essential (primary) hypertension: Secondary | ICD-10-CM | POA: Diagnosis not present

## 2021-01-30 DIAGNOSIS — F17210 Nicotine dependence, cigarettes, uncomplicated: Secondary | ICD-10-CM | POA: Diagnosis not present

## 2021-01-30 DIAGNOSIS — E119 Type 2 diabetes mellitus without complications: Secondary | ICD-10-CM | POA: Diagnosis not present

## 2021-01-30 DIAGNOSIS — M79671 Pain in right foot: Secondary | ICD-10-CM | POA: Diagnosis present

## 2021-01-30 DIAGNOSIS — Z79899 Other long term (current) drug therapy: Secondary | ICD-10-CM | POA: Insufficient documentation

## 2021-01-30 DIAGNOSIS — M722 Plantar fascial fibromatosis: Secondary | ICD-10-CM | POA: Diagnosis not present

## 2021-01-30 NOTE — ED Triage Notes (Signed)
PT STATES HE WAS AT WORK YESTERDAY AND NOTICES RIGHT SIDE OF FOOT/TOE WAS PAINFUL, WANTS TO GET IT CHECKED OUT, ED TRIAGE RN DI NOT SEE SWELLING , PAIN X 1 DAY , PT STATES NO OTHER COMPLAINTS

## 2021-01-30 NOTE — ED Provider Notes (Addendum)
[facility] Emergency Department Provider Note ____________________________________________   Event Date/Time   First MD Initiated Contact with Patient 01/30/21 1115     (approximate)  I have reviewed the triage vital signs and the nursing notes.  HISTORY  Chief Complaint Toe Pain (RIGHT TOE JOINT PAIN X 1 DAY NO OTHER COMPLAINTS )   HPI Harold Owens is a 50 y.o. malewho presents to the ED for evaluation of right-sided plantar pain.  Chart review indicates history obesity, DM, HTN and COPD.  Patient reports developing atraumatic pain to the plantar aspect of his right foot while at work yesterday.  He reports ambulating with steel toe boots on a concrete surface all day at work.  Denies any focal injuries or trauma.  He reports developing pain to the plantar aspect of his foot only, no dorsal pain or swelling.  He reports distal plantar foot pain at the base of his first and second metatarsals.  He has never felt this before.  Denies new boots or footwear.  Reports his coworkers thought he might have gout and urged him to go get checked out.  He denies any fevers, skin changes, proximal pain or other symptoms.  Past Medical History:  Diagnosis Date   COPD (chronic obstructive pulmonary disease) (HCC)    Diabetes mellitus without complication (HCC)    Hypertension     There are no problems to display for this patient.   Past Surgical History:  Procedure Laterality Date   TONSILLECTOMY     WRIST SURGERY      Prior to Admission medications   Medication Sig Start Date End Date Taking? Authorizing Provider  albuterol (PROVENTIL) (2.5 MG/3ML) 0.083% nebulizer solution Take 3 mLs (2.5 mg total) by nebulization every 6 (six) hours as needed for wheezing or shortness of breath. 04/29/18   Loleta Rose, MD  albuterol (VENTOLIN HFA) 108 (90 Base) MCG/ACT inhaler Inhale 2 puffs into the lungs every 6 (six) hours as needed. 07/11/15 07/10/16   [provider]  amLODipine (NORVASC) 5 MG tablet Take 1 tablet (5 mg total) by mouth daily. 04/29/18 04/29/19  Loleta Rose, MD  atorvastatin (LIPITOR) 20 MG tablet Take 1 tablet (20 mg total) by mouth daily. 04/29/18 04/29/19  Loleta Rose, MD  hydrochlorothiazide (MICROZIDE) 12.5 MG capsule Take 1 capsule (12.5 mg total) by mouth daily. 04/29/18 04/29/19  Loleta Rose, MD  meloxicam (MOBIC) 15 MG tablet Take 1 tablet (15 mg total) by mouth daily. 01/11/21 01/11/22  Sherrie Mustache Roselyn Bering, PA-C  sildenafil (VIAGRA) 100 MG tablet Take 0.5 mg by mouth daily as needed. 11/30/14   [provider]    Allergies Penicillins  Family History  Problem Relation Age of Onset   Cancer Mother    Diabetes Sister    Hypertension Sister    Hypertension Brother     Social History Social History   Tobacco Use   Smoking status: Every Day    Packs/day: 1.00    Years: 25.00    Pack years: 25.00    Types: Cigarettes   Smokeless tobacco: Never  Vaping Use   Vaping Use: Never used  Substance Use Topics   Alcohol use: No    Alcohol/week: 0.0 standard drinks   Drug use: No    Review of Systems  Constitutional: No fever/chills Eyes: No visual changes. ENT: No sore throat. Cardiovascular: Denies chest pain. Respiratory: Denies shortness of breath. Gastrointestinal: No abdominal pain.  No nausea, no vomiting.  No diarrhea.  No  constipation. Genitourinary: Negative for dysuria. Musculoskeletal: Negative for back pain. Positive atraumatic right foot pain Skin: Negative for rash. Neurological: Negative for headaches, focal weakness or numbness.  ____________________________________________   PHYSICAL EXAM:  VITAL SIGNS: Vitals:   01/30/21 1013  BP: 128/70  Pulse: 87  Resp: 17  Temp: 98.8 F (37.1 C)  SpO2: 98%     Constitutional: Alert and oriented. Well appearing and in no acute distress. Eyes: Conjunctivae are normal. PERRL. EOMI. Head: Atraumatic. Nose: No  congestion/rhinnorhea. Mouth/Throat: Mucous membranes are moist.  Oropharynx non-erythematous. Neck: No stridor. No cervical spine tenderness to palpation. Cardiovascular: Normal rate, regular rhythm. Grossly normal heart sounds.  Good peripheral circulation. Respiratory: Normal respiratory effort.  No retractions. Lungs CTAB. Gastrointestinal: Soft , nondistended, nontender to palpation. No CVA tenderness. Musculoskeletal:  No joint effusions. No signs of acute trauma. Heavy calluses to bilateral feet.  Most tender to the distal aspect of the plantar fascia where it inserts.  Great toe has no erythema, swelling or signs of trauma.  No dorsal pain to palpation throughout his foot and toes. Neurologic:  Normal speech and language. No gross focal neurologic deficits are appreciated. No gait instability noted. Skin:  Skin is warm, dry and intact. No rash noted. Psychiatric: Mood and affect are normal. Speech and behavior are normal.  ____________________________________________   LABS (all labs ordered are listed, but only abnormal results are displayed)  Labs Reviewed - No data to display ____________________________________________  12 Lead EKG   ____________________________________________  RADIOLOGY  ED MD interpretation: Plain film of the right foot reviewed by me without evidence of fracture, dislocation, bony erosions.  Official radiology report(s): DG Foot Complete Right  Result Date: 01/30/2021 CLINICAL DATA:  Plantar pain, suspect plantar fasciitis. Evaluate for bony pathology, erosion EXAM: RIGHT FOOT COMPLETE - 3+ VIEW COMPARISON:  None. FINDINGS: There is no acute fracture or dislocation. There is no evidence of osseous destruction or erosion. There is mild Achilles enthesopathy. There is no appreciable inferior calcaneal spurring. Alignment is normal. The Lisfranc and Chopart joints are intact. The soft tissues are unremarkable. IMPRESSION: Unremarkable foot radiographs.  Electronically Signed   By: Lesia Hausen M.D.   On: 01/30/2021 12:23    ____________________________________________   PROCEDURES and INTERVENTIONS  Procedure(s) performed (including Critical Care):  Procedures  Medications - No data to display  ____________________________________________   MDM / ED COURSE   49 year old male present to the ED with plantar foot pain, likely plantar fasciitis, amenable to outpatient management.  Normal vitals and generally reassuring examination.  No evidence of gout, fracture or acute trauma.  He is most tender to the insertion of the plantar fascia.  X-ray reassuring without evidence of bony injury, osteo-.  Will discharge with conservative management information for podiatric follow-up.  Return precautions were discussed.  Clinical Course as of 01/30/21 1610  Tue Jan 30, 2021  1220 Educated patient my suspicion that this is more likely plantar fasciitis than gout.  We discussed x-rays to ensure no concerning signs or bony erosion considering his comorbidities.  We discussed treatment for plantar fasciitis with OTC shoe inserts and following up with podiatry.  We discussed signs of gout. [DS]    Clinical Course User Index [DS] Delton Prairie, MD    ____________________________________________   FINAL CLINICAL IMPRESSION(S) / ED DIAGNOSES  Final diagnoses:  Plantar fasciitis     ED Discharge Orders     None        Polo Mcmartin   Note:  This  document was prepared using Conservation officer, historic buildings and may include unintentional dictation errors.    Delton Prairie, MD 01/30/21 1610    Delton Prairie, MD 01/30/21 [phone]

## 2022-07-13 ENCOUNTER — Emergency Department: Payer: BC Managed Care – PPO

## 2022-07-13 ENCOUNTER — Other Ambulatory Visit: Payer: Self-pay

## 2022-07-13 ENCOUNTER — Emergency Department
Admission: EM | Admit: 2022-07-13 | Discharge: 2022-07-13 | Disposition: A | Discharge status: Home / Self Care | Payer: BC Managed Care – PPO | Attending: Emergency Medicine | Admitting: Emergency Medicine

## 2022-07-13 DIAGNOSIS — J449 Chronic obstructive pulmonary disease, unspecified: Secondary | ICD-10-CM | POA: Insufficient documentation

## 2022-07-13 DIAGNOSIS — M25532 Pain in left wrist: Secondary | ICD-10-CM | POA: Diagnosis not present

## 2022-07-13 DIAGNOSIS — E119 Type 2 diabetes mellitus without complications: Secondary | ICD-10-CM | POA: Insufficient documentation

## 2022-07-13 DIAGNOSIS — I10 Essential (primary) hypertension: Secondary | ICD-10-CM | POA: Insufficient documentation

## 2022-07-13 MED ORDER — KETOROLAC TROMETHAMINE 30 MG/ML IJ SOLN
30.0000 mg | Freq: Once | INTRAMUSCULAR | Status: AC
  Administered 2022-07-13: 30 mg via INTRAMUSCULAR
  Filled 2022-07-13: qty 1

## 2022-07-13 NOTE — ED Provider Notes (Signed)
[facility] Provider Note    Event Date/Time   First MD Initiated Contact with Patient 07/13/22 [phone]     (approximate)   History   Chief Complaint Wrist Pain   HPI  Harold Owens is a 52 y.o. male with past medical history of hypertension, diabetes, and COPD who presents to the ED complaining of wrist pain.  Patient reports that he has been dealing with increasing pain over the ulnar portion of his left wrist for about the past week.  Pain became severe overnight last night, to the point that he had been unable to sleep.  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  He denies any specific traumatic injury, has been taking Tylenol without significant relief.  He has not noticed any redness or swelling to the wrist, denies any fevers.     Physical Exam   Triage Vital Signs: ED Triage Vitals  Enc Vitals Group     BP 07/13/22 0531 (!) 133/93     Pulse Rate 07/13/22 0531 76     Resp 07/13/22 0531 18     Temp 07/13/22 0531 98.5 F (36.9 C)     Temp Source 07/13/22 0531 Oral     SpO2 07/13/22 0531 97 %     Weight 07/13/22 0533 265 lb (120.2 kg)     Height 07/13/22 0533 '5\' 6"'$  (1.676 m)     Head Circumference --      Peak Flow --      Pain Score 07/13/22 0533 10     Pain Loc --      Pain Edu? --      Excl. in [place]? --     Most recent vital signs: Vitals:   07/13/22 0531  BP: (!) 133/93  Pulse: 76  Resp: 18  Temp: 98.5 F (36.9 C)  SpO2: 97%    Constitutional: Alert and oriented. Eyes: Conjunctivae are normal. Head: Atraumatic. Nose: No congestion/rhinnorhea. Mouth/Throat: Mucous membranes are moist.  Cardiovascular: Normal rate, regular rhythm. Grossly normal heart sounds.  2+ radial pulses bilaterally. Respiratory: Normal respiratory effort.  No retractions. Lungs CTAB. Gastrointestinal: Soft and nontender. No distention. Musculoskeletal: No lower extremity tenderness nor  edema.  Tenderness to palpation noted over the ulnar portion of the left wrist extending over the dorsum of his fourth and fifth metacarpals.  No obvious deformity noted and no erythema or warmth noted.  Range of motion intact to left wrist with mild discomfort. Neurologic:  Normal speech and language. No gross focal neurologic deficits are appreciated.    ED Results / Procedures / Treatments   Labs (all labs ordered are listed, but only abnormal results are displayed) Labs Reviewed - No data to display  RADIOLOGY Wrist x-ray reviewed and interpreted by me with no fracture or dislocation.  PROCEDURES:  Critical Care performed: No  Procedures   MEDICATIONS ORDERED IN ED: Medications  ketorolac (TORADOL) 30 MG/ML injection 30 mg (has no administration in time range)     IMPRESSION / MDM / ASSESSMENT AND PLAN / ED COURSE  I reviewed the triage vital signs and the nursing notes.                              52 y.o. male with past medical history of hypertension, diabetes, and COPD who presents to the ED complaining of left lateral wrist pain for the  past week, acutely worse overnight.  Patient's presentation is most consistent with acute complicated illness / injury requiring diagnostic workup.  Differential diagnosis includes, but is not limited to, fracture, dislocation, osteoarthritis, septic arthritis, cellulitis, tendinitis.  Patient well-appearing and in no acute distress, vital signs are unremarkable.  X-ray imaging is negative for acute process, patient neurovascularly intact distally with cap refill less than 2 seconds and good range of motion.  No findings to suggest cellulitis or septic arthritis.  We will place patient in brace for comfort, treat pain with IM Toradol.  He is appropriate for discharge home with PCP follow-up, was counseled on rest, ice, and to alternate Tylenol and NSAIDs.  He was counseled to return to the ED for new or worsening symptoms, patient agrees  with plan.      FINAL CLINICAL IMPRESSION(S) / ED DIAGNOSES   Final diagnoses:  Left wrist pain     Rx / DC Orders   ED Discharge Orders     None        Note:  This document was prepared using Dragon voice recognition software and may include unintentional dictation errors.   Blake Divine, MD 07/13/22 [phone]

## 2022-07-13 NOTE — ED Triage Notes (Addendum)
L wrist pain. Reports woke up this morning with lateral wrist pain. Hx of tendonitis with surgical repair in 2014. States does not hurt on side of prior surgery. CMS intact. Reports pain in fingers as well and having difficult time gripping items. No relief with tylenol, motrin, or meloxicam. Pt denies fall or injury

## 2023-01-23 ENCOUNTER — Encounter: Payer: Self-pay | Admitting: Emergency Medicine

## 2023-01-23 ENCOUNTER — Other Ambulatory Visit: Payer: Self-pay

## 2023-01-23 ENCOUNTER — Emergency Department
Admission: EM | Admit: 2023-01-23 | Discharge: 2023-01-23 | Disposition: A | Discharge status: Home / Self Care | Payer: BC Managed Care – PPO | Attending: Emergency Medicine | Admitting: Emergency Medicine

## 2023-01-23 DIAGNOSIS — M79602 Pain in left arm: Secondary | ICD-10-CM | POA: Diagnosis present

## 2023-01-23 MED ORDER — PREDNISONE 10 MG (21) PO TBPK: ORAL_TABLET | ORAL | 0 refills | Status: DC

## 2023-01-23 MED ORDER — DEXAMETHASONE SODIUM PHOSPHATE 10 MG/ML IJ SOLN
10.0000 mg | Freq: Once | INTRAMUSCULAR | Status: AC
  Administered 2023-01-23: 10 mg via INTRAMUSCULAR
  Filled 2023-01-23: qty 1

## 2023-01-23 MED ORDER — METHOCARBAMOL 500 MG PO TABS: 500.0000 mg | ORAL_TABLET | Freq: Three times a day (TID) | ORAL | 0 refills | Status: AC | PRN

## 2023-01-23 NOTE — ED Provider Notes (Signed)
[facility] Provider Note  Patient Contact: 5:30 PM (approximate)   History   Arm Pain   HPI  Harold Owens is a 52 y.o. male presents to the emergency department with left arm pain.  Patient has pain in multiple areas of his arm including his left shoulder, left shoulder blade and volar aspect of left forearm.  Patient states that he does not have reproducible pain with range of motion at the neck.  He states that he drives with his left arm and states that he constantly has his shoulder flexed.  Patient does state that he has a history of de Quervain's tenosynovitis but states that his current symptoms do not feel even remotely similar.  No chest pain, chest tightness or shortness of breath.  Patient states he has a history of prediabetes but that his sugars are well-controlled.  He has been taking Tylenol and ibuprofen with little relief.      Physical Exam   Triage Vital Signs: ED Triage Vitals  Encounter Vitals Group     BP 01/23/23 1555 124/86     Systolic BP Percentile --      Diastolic BP Percentile --      Pulse Rate 01/23/23 1555 82     Resp 01/23/23 1555 18     Temp 01/23/23 1555 98 F (36.7 C)     Temp Source 01/23/23 1555 Oral     SpO2 01/23/23 1555 97 %     Weight 01/23/23 1555 265 lb (120.2 kg)     Height 01/23/23 1555 5\' 6"  (1.676 m)     Head Circumference --      Peak Flow --      Pain Score 01/23/23 1555 10     Pain Loc --      Pain Education --      Exclude from Growth Chart --     Most recent vital signs: Vitals:   01/23/23 1555  BP: 124/86  Pulse: 82  Resp: 18  Temp: 98 F (36.7 C)  SpO2: 97%     General: Alert and in no acute distress. Eyes:  PERRL. EOMI. Head: No acute traumatic findings ENT:      Nose: No congestion/rhinnorhea.      Mouth/Throat: Mucous membranes are moist. Neck: No stridor. No cervical spine tenderness to palpation.  No reproducible pain with range of motion testing at the  neck. Cardiovascular:  Good peripheral perfusion Respiratory: Normal respiratory effort without tachypnea or retractions. Lungs CTAB. Good air entry to the bases with no decreased or absent breath sounds. Gastrointestinal: Bowel sounds 4 quadrants. Soft and nontender to palpation. No guarding or rigidity. No palpable masses. No distention. No CVA tenderness. Musculoskeletal: Full range of motion to all extremities.  Negative Finkelstein test.  Negative Tinel and Phalen's.  Palpable radial and ulnar pulses bilaterally and symmetrically. Neurologic:  No gross focal neurologic deficits are appreciated.  Skin:   No rash noted Other:   ED Results / Procedures / Treatments   Labs (all labs ordered are listed, but only abnormal results are displayed) Labs Reviewed - No data to display       PROCEDURES:  Critical Care performed: No  Procedures   MEDICATIONS ORDERED IN ED: Medications  dexamethasone (DECADRON) injection 10 mg (has no administration in time range)     IMPRESSION / MDM / ASSESSMENT AND PLAN / ED COURSE  I reviewed the triage vital signs and the nursing notes.  Assessment and plan Arm pain 52 year old male presents to the emergency department with left arm pain as described in HPI.  Vital signs are reassuring at triage.  On exam, patient was alert, active and nontoxic-appearing.  He did have reproducible pain with some range of motion at the left shoulder but no rotator cuff weakness testing.  Pain was not reproduced with range of motion at the neck.  He had a negative Tinel and Phalen's test, decreasing suspicion for carpal tunnel.  No weakness with biceps tendon testing, low suspicion for biceps tendon rupture.  Negative Finkelstein test, low suspicion for de Quervain's tenosynovitis.  Suspect deltoid strain.  Will start patient on tapered prednisone as he has tried Tylenol and ibuprofen this week with little improvement and a muscle  relaxer to take at night.  Patient was cautioned not to drive with muscle relaxer in his system.      FINAL CLINICAL IMPRESSION(S) / ED DIAGNOSES   Final diagnoses:  Pain of left upper extremity     Rx / DC Orders   ED Discharge Orders          Ordered    predniSONE (STERAPRED UNI-PAK 21 TAB) 10 MG (21) TBPK tablet        01/23/23 1731    methocarbamol (ROBAXIN) 500 MG tablet  Every 8 hours PRN        01/23/23 1732             Note:  This document was prepared using Dragon voice recognition software and may include unintentional dictation errors.   Pia Mau Bevier, PA-C 01/23/23 1738    Minna Antis, MD 01/23/23 1925

## 2023-01-23 NOTE — Discharge Instructions (Signed)
Take tapered prednisone as directed. You have also been prescribed a muscle relaxer that you can take at night. Please do not take muscle relaxer and drive If symptoms still persist after a week, please follow-up with orthopedics.

## 2023-01-23 NOTE — ED Notes (Signed)
See triage note  Presents with pain to left wrist and into left shoulder  Denies any new injury

## 2023-01-23 NOTE — ED Triage Notes (Signed)
Patient arrives ambulatory by POV c/o left arm and shoulder pain x a few weeks and worsened since Tuesday. States when he lifted his arm Tuesday felt a pulling pain. Reports hx of tendonitis in that arm. Denies any injury.

## 2023-01-27 NOTE — Group Note (Deleted)

## 2023-07-22 ENCOUNTER — Other Ambulatory Visit: Payer: Self-pay

## 2023-07-22 ENCOUNTER — Encounter: Payer: Self-pay | Admitting: *Deleted

## 2023-07-22 ENCOUNTER — Emergency Department

## 2023-07-22 ENCOUNTER — Emergency Department
Admission: EM | Admit: 2023-07-22 | Discharge: 2023-07-22 | Disposition: A | Discharge status: Home / Self Care | Attending: Emergency Medicine | Admitting: Emergency Medicine

## 2023-07-22 DIAGNOSIS — J441 Chronic obstructive pulmonary disease with (acute) exacerbation: Secondary | ICD-10-CM | POA: Insufficient documentation

## 2023-07-22 DIAGNOSIS — I10 Essential (primary) hypertension: Secondary | ICD-10-CM | POA: Insufficient documentation

## 2023-07-22 DIAGNOSIS — Z79899 Other long term (current) drug therapy: Secondary | ICD-10-CM | POA: Insufficient documentation

## 2023-07-22 DIAGNOSIS — E119 Type 2 diabetes mellitus without complications: Secondary | ICD-10-CM | POA: Insufficient documentation

## 2023-07-22 DIAGNOSIS — J189 Pneumonia, unspecified organism: Secondary | ICD-10-CM

## 2023-07-22 DIAGNOSIS — R0602 Shortness of breath: Secondary | ICD-10-CM | POA: Diagnosis present

## 2023-07-22 DIAGNOSIS — J181 Lobar pneumonia, unspecified organism: Secondary | ICD-10-CM | POA: Diagnosis not present

## 2023-07-22 LAB — TROPONIN I (HIGH SENSITIVITY)
Troponin I (High Sensitivity): 7 ng/L (ref <18)
Troponin I (High Sensitivity): 7 ng/L (ref <18)

## 2023-07-22 LAB — BASIC METABOLIC PANEL
Anion gap: 8 (ref 5–15)
BUN: 19 mg/dL (ref 6–20)
CO2: 30 mmol/L (ref 22–32)
Calcium: 8.9 mg/dL (ref 8.9–10.3)
Chloride: 99 mmol/L (ref 98–111)
Creatinine, Ser: 0.87 mg/dL (ref 0.61–1.24)
GFR, Estimated: >60 mL/min (ref >60)
Glucose, Bld: 109 mg/dL — ABNORMAL HIGH (ref 70–99)
Potassium: 3.4 mmol/L — ABNORMAL LOW (ref 3.5–5.1)
Sodium: 137 mmol/L (ref 135–145)

## 2023-07-22 LAB — CBC
HCT: 47.5 % (ref 39.0–52.0)
Hemoglobin: 15.7 g/dL (ref 13.0–17.0)
MCH: 30.9 pg (ref 26.0–34.0)
MCHC: 33.1 g/dL (ref 30.0–36.0)
MCV: 93.5 fL (ref 80.0–100.0)
Platelets: 238 10*3/uL (ref 150–400)
RBC: 5.08 MIL/uL (ref 4.22–5.81)
RDW: 13.7 % (ref 11.5–15.5)
WBC: 10.4 10*3/uL (ref 4.0–10.5)
nRBC: 0 % (ref 0.0–0.2)

## 2023-07-22 MED ORDER — PREDNISONE 20 MG PO TABS: 40.0000 mg | ORAL_TABLET | Freq: Every day | ORAL | 0 refills | Status: AC

## 2023-07-22 MED ORDER — IPRATROPIUM-ALBUTEROL 0.5-2.5 (3) MG/3ML IN SOLN
3.0000 mL | Freq: Once | RESPIRATORY_TRACT | Status: AC
  Administered 2023-07-22: 3 mL via RESPIRATORY_TRACT
  Filled 2023-07-22: qty 3

## 2023-07-22 MED ORDER — METHYLPREDNISOLONE SODIUM SUCC 125 MG IJ SOLR
125.0000 mg | Freq: Once | INTRAMUSCULAR | Status: AC
  Administered 2023-07-22: 125 mg via INTRAVENOUS
  Filled 2023-07-22: qty 2

## 2023-07-22 MED ORDER — LEVOFLOXACIN 750 MG PO TABS: 750.0000 mg | ORAL_TABLET | Freq: Every day | ORAL | 0 refills | Status: AC

## 2023-07-22 MED ORDER — LEVOFLOXACIN 750 MG PO TABS
750.0000 mg | ORAL_TABLET | Freq: Once | ORAL | Status: AC
  Administered 2023-07-22: 750 mg via ORAL
  Filled 2023-07-22: qty 1

## 2023-07-22 NOTE — Discharge Instructions (Signed)
 Please take medications as prescribed.  Use albuterol/nebulizers at home as needed.  Return to the ER for any fevers, worsening cough, shortness of breath or any urgent changes in your health.

## 2023-07-22 NOTE — ED Provider Notes (Signed)
  EMERGENCY DEPARTMENT AT [facility] Provider Note   CSN: [id number] Arrival date & time: 07/22/23  1542     History  Chief Complaint  Patient presents with   Cough   Shortness of Breath    Harold Owens is a 53 y.o. male with history of hypertension diabetes, COPD presents to the emergency department for evaluation of shortness of breath, cough.  States for 4 days he has had cough and congestion.  He has felt real tight in his chest and has feels short of breath with activity.  He tried using a inhaler this morning with little relief.  He denies any swelling or edema in his lower legs.  He is not having any chest pain but states his chest feels tight.  HPI     Home Medications Prior to Admission medications   Medication Sig Start Date End Date Taking? Authorizing Provider  levofloxacin (LEVAQUIN) 750 MG tablet Take 1 tablet (750 mg total) by mouth daily for 7 days. 07/22/23 07/29/23 Yes Evon Slack, PA-C  predniSONE (DELTASONE) 20 MG tablet Take 2 tablets (40 mg total) by mouth daily for 5 days. 07/22/23 07/27/23 Yes Evon Slack, PA-C  albuterol (PROVENTIL) (2.5 MG/3ML) 0.083% nebulizer solution Take 3 mLs (2.5 mg total) by nebulization every 6 (six) hours as needed for wheezing or shortness of breath. 04/29/18   Loleta Rose, MD  albuterol (VENTOLIN HFA) 108 (90 Base) MCG/ACT inhaler Inhale 2 puffs into the lungs every 6 (six) hours as needed. 07/11/15 07/10/16  [provider]  amLODipine (NORVASC) 5 MG tablet Take 1 tablet (5 mg total) by mouth daily. 04/29/18 04/29/19  Loleta Rose, MD  atorvastatin (LIPITOR) 20 MG tablet Take 1 tablet (20 mg total) by mouth daily. 04/29/18 04/29/19  Loleta Rose, MD  hydrochlorothiazide (MICROZIDE) 12.5 MG capsule Take 1 capsule (12.5 mg total) by mouth daily. 04/29/18 04/29/19  Loleta Rose, MD  sildenafil (VIAGRA) 100 MG tablet Take 0.5 mg by mouth daily as needed. 11/30/14   [provider]       Allergies    Penicillins    Review of Systems   Review of Systems  Physical Exam Updated Vital Signs BP (!) 147/82 (BP Location: Right Arm)   Pulse 86   Temp 98.9 F (37.2 C) (Oral)   Resp 20   Ht 5\' 6"  (1.676 m)   Wt 129.3 kg   SpO2 94%   BMI 46.00 kg/m  Physical Exam Constitutional:      General: He is not in acute distress.    Appearance: He is well-developed.  HENT:     Head: Normocephalic and atraumatic.     Jaw: No trismus.     Right Ear: Hearing, tympanic membrane, ear canal and external ear normal.     Left Ear: Hearing, tympanic membrane, ear canal and external ear normal.     Nose: Rhinorrhea present.     Right Sinus: No maxillary sinus tenderness or frontal sinus tenderness.     Left Sinus: No maxillary sinus tenderness or frontal sinus tenderness.     Mouth/Throat:     Pharynx: No oropharyngeal exudate, posterior oropharyngeal erythema or uvula swelling.     Tonsils: No tonsillar abscesses.  Eyes:     Conjunctiva/sclera: Conjunctivae normal.  Cardiovascular:     Rate and Rhythm: Normal rate and regular rhythm.  Pulmonary:     Effort: Pulmonary effort is normal. No respiratory distress.     Breath sounds: No stridor. No  decreased breath sounds or wheezing.     Comments: Decreased air movement bilaterally.  No significant wheezing Chest:     Chest wall: No tenderness.  Abdominal:     General: There is no distension.     Palpations: Abdomen is soft.     Tenderness: There is no abdominal tenderness. There is no guarding.  Musculoskeletal:        General: Normal range of motion.     Cervical back: Normal range of motion.  Skin:    General: Skin is warm and dry.     Findings: No rash.  Neurological:     General: No focal deficit present.     Mental Status: He is alert and oriented to person, place, and time. Mental status is at baseline.  Psychiatric:        Behavior: Behavior normal.        Thought Content: Thought content normal.        Judgment:  Judgment normal.     ED Results / Procedures / Treatments   Labs (all labs ordered are listed, but only abnormal results are displayed) Labs Reviewed  BASIC METABOLIC PANEL - Abnormal; Notable for the following components:      Result Value   Potassium 3.4 (*)    Glucose, Bld 109 (*)    All other components within normal limits  SARS CORONAVIRUS 2 BY RT PCR  CBC  TROPONIN I (HIGH SENSITIVITY)  TROPONIN I (HIGH SENSITIVITY)    EKG None  Radiology DG Chest 2 View Result Date: 07/22/2023 CLINICAL DATA:  Two day history of cough EXAM: CHEST - 2 VIEW COMPARISON:  Chest radiograph dated 08/07/2018 FINDINGS: Normal lung volumes. Hazy opacity projecting over the right lower lung. No pleural effusion or pneumothorax. The heart size and mediastinal contours are within normal limits. No acute osseous abnormality. IMPRESSION: Hazy opacity projecting over the right lower lung, which may represent atelectasis or pneumonia. Electronically Signed   By: Agustin Cree M.D.   On: 07/22/2023 19:05    Procedures Procedures    Medications Ordered in ED Medications  ipratropium-albuterol (DUONEB) 0.5-2.5 (3) MG/3ML nebulizer solution 3 mL (3 mLs Nebulization Given 07/22/23 1757)  methylPREDNISolone sodium succinate (SOLU-MEDROL) 125 mg/2 mL injection 125 mg (125 mg Intravenous Given 07/22/23 1757)  ipratropium-albuterol (DUONEB) 0.5-2.5 (3) MG/3ML nebulizer solution 3 mL (3 mLs Nebulization Given 07/22/23 1901)  levofloxacin (LEVAQUIN) tablet 750 mg (750 mg Oral Given 07/22/23 1928)    ED Course/ Medical Decision Making/ A&P                                 Medical Decision Making Amount and/or Complexity of Data Reviewed Labs: ordered. Radiology: ordered.  Risk Prescription drug management.   53 year old male with COPD presents with COPD exacerbation.  Chest x-ray shows focal opacity in right lower lung concerning for atelectasis or pneumonia.  With COPD and productive cough, will start on Levaquin.   He is given prednisone to help with inflammation and will continue with nebulizers at home.  He was given Solu-Medrol and 2 DuoNeb's here in the ED which completely resolved his wheezing and chest tightness.  Cardiac workup was negative including EKG and troponins.  CBC and BMP within normal limits.  Flu/COVID test negative.  Patient educated on signs and symptoms return to the ED for. Final Clinical Impression(s) / ED Diagnoses Final diagnoses:  COPD exacerbation (HCC)  Community acquired pneumonia of  right lower lobe of lung    Rx / DC Orders ED Discharge Orders          Ordered    predniSONE (DELTASONE) 20 MG tablet  Daily        07/22/23 1930    levofloxacin (LEVAQUIN) 750 MG tablet  Daily        07/22/23 1930              Ronnette Juniper 07/22/23 1933    Corena Herter, MD 07/23/23 0010

## 2023-07-22 NOTE — ED Triage Notes (Signed)
 Pt ambulatory to triage.  Pt reports a cough for 2 days.  Pt has sob since last night  no chest pain   cig smoker   no n/v/  pt alert

## 2024-06-24 ENCOUNTER — Emergency Department
Admission: EM | Admit: 2024-06-24 | Discharge: 2024-06-24 | Disposition: A | Discharge status: Home / Self Care | Attending: Emergency Medicine | Admitting: Emergency Medicine

## 2024-06-24 ENCOUNTER — Other Ambulatory Visit: Payer: Self-pay

## 2024-06-24 DIAGNOSIS — M436 Torticollis: Secondary | ICD-10-CM | POA: Insufficient documentation

## 2024-06-24 DIAGNOSIS — M25511 Pain in right shoulder: Secondary | ICD-10-CM | POA: Insufficient documentation

## 2024-06-24 DIAGNOSIS — E119 Type 2 diabetes mellitus without complications: Secondary | ICD-10-CM | POA: Insufficient documentation

## 2024-06-24 DIAGNOSIS — J449 Chronic obstructive pulmonary disease, unspecified: Secondary | ICD-10-CM | POA: Insufficient documentation

## 2024-06-24 DIAGNOSIS — I10 Essential (primary) hypertension: Secondary | ICD-10-CM | POA: Insufficient documentation

## 2024-06-24 MED ORDER — BACLOFEN 10 MG PO TABS: 10.0000 mg | ORAL_TABLET | Freq: Three times a day (TID) | ORAL | 0 refills | Status: AC

## 2024-06-24 MED ORDER — KETOROLAC TROMETHAMINE 15 MG/ML IJ SOLN
15.0000 mg | Freq: Once | INTRAMUSCULAR | Status: AC
  Administered 2024-06-24: 15 mg via INTRAMUSCULAR
  Filled 2024-06-24: qty 1

## 2024-06-24 MED ORDER — MELOXICAM 15 MG PO TABS: 15.0000 mg | ORAL_TABLET | Freq: Every day | ORAL | 0 refills | Status: AC

## 2024-06-24 NOTE — ED Triage Notes (Signed)
 Pt reports he woke up with right sided neck pain that radiates into his right shoulder blade, pain is worse with movement of neck.

## 2024-06-24 NOTE — ED Provider Notes (Signed)
 "  [facility] Provider Note    Event Date/Time   First MD Initiated Contact with Patient 06/24/24 [phone]     (approximate)   History   Torticollis (/)   HPI  Harold Owens is a 54 y.o. male history of hypertension, diabetes, COPD presents emergency department with right shoulder and neck pain.  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  Having a lot of discomfort in the right shoulder with large spasm.  No numbness or tingling.  No loss of motion.  Denies chest pain, shortness of breath      Physical Exam   Triage Vital Signs: ED Triage Vitals  Encounter Vitals Group     BP 06/24/24 0540 128/72     Girls Systolic BP Percentile --      Girls Diastolic BP Percentile --      Boys Systolic BP Percentile --      Boys Diastolic BP Percentile --      Pulse Rate 06/24/24 0540 96     Resp 06/24/24 0540 18     Temp 06/24/24 0540 98 F (36.7 C)     Temp Source 06/24/24 0740 Oral     SpO2 06/24/24 0540 96 %     Weight 06/24/24 0539 (!) 301 lb (136.5 kg)     Height 06/24/24 0539 5' 6 (1.676 m)     Head Circumference --      Peak Flow --      Pain Score 06/24/24 0539 9     Pain Loc --      Pain Education --      Exclude from Growth Chart --     Most recent vital signs: Vitals:   06/24/24 0730 06/24/24 0740  BP:  130/85  Pulse:  94  Resp:  19  Temp:  98 F (36.7 C)  SpO2: 99% 100%     General: Awake, no distress.   CV:  Good peripheral perfusion.  Resp:  Normal effort. Abd:  No distention.   Other:  Right shoulder with spasm noted along the trapezius and SCM, full range of motion does reproduce discomfort, neurovascular intact, grips equal bilaterally   ED Results / Procedures / Treatments   Labs (all labs ordered are listed, but only abnormal results are displayed) Labs Reviewed - No data to display   EKG     RADIOLOGY     PROCEDURES:   Procedures  Critical  Care:  no Chief Complaint  Patient presents with   Torticollis           MEDICATIONS ORDERED IN ED: Medications  ketorolac  (TORADOL ) 15 MG/ML injection 15 mg (15 mg Intramuscular Given 06/24/24 0736)     IMPRESSION / MDM / ASSESSMENT AND PLAN / ED COURSE  I reviewed the triage vital signs and the nursing notes.                              Differential diagnosis includes, but is not limited to, muscle strain, torticollis, rotator cuff injury, rotator cuff tear, MI  Patient's presentation is most consistent with acute illness / injury with system symptoms.   Medications given: Toradol  15 mg IM  MI less likely, patient does not have any chest pain, pain is reproduced with movement and does have spasm in the right shoulder.  I do not see any red flags to warrant  blood work EKG etc.  Toradol  given for pain.  Will give him a muscle relaxer here but he is driving.  I will send this to the pharmacy.  He was given a prescription for meloxicam , baclofen .  Over-the-counter measures discussed.  Exercises to help with his shoulder also provided.  Follow-up with orthopedics if not improving in 1 week.  Return if worsening.  He is in agreement treatment plan.  Discharged stable condition.      FINAL CLINICAL IMPRESSION(S) / ED DIAGNOSES   Final diagnoses:  Torticollis     Rx / DC Orders   ED Discharge Orders          Ordered    meloxicam  (MOBIC ) 15 MG tablet  Daily        06/24/24 0735    baclofen  (LIORESAL ) 10 MG tablet  3 times daily        06/24/24 9264             Note:  This document was prepared using Dragon voice recognition software and may include unintentional dictation errors.    Gasper Devere ORN, PA-C 06/24/24 9253    Willo Dunnings, MD 06/24/24 1529  "

## 2024-06-24 NOTE — Discharge Instructions (Signed)
 Apply wet heat followed by ice.  Do shoulder stretches/exercises.  Follow-up with orthopedics if not improving 1 week.  Return if worsening.
